# Patient Record
Sex: Female | Born: 1985 | Race: White | Hispanic: No | Marital: Married | Smoking: Never smoker
Health system: Southern US, Community
[De-identification: ages and names within clinical notes are randomized; demographics above are authoritative.]

## PROBLEM LIST (undated history)

## (undated) DIAGNOSIS — G43909 Migraine, unspecified, not intractable, without status migrainosus: Secondary | ICD-10-CM

## (undated) DIAGNOSIS — R87629 Unspecified abnormal cytological findings in specimens from vagina: Secondary | ICD-10-CM

## (undated) HISTORY — DX: Migraine, unspecified, not intractable, without status migrainosus: G43.909

## (undated) HISTORY — PX: NO PAST SURGERIES: SHX2092

## (undated) HISTORY — DX: Unspecified abnormal cytological findings in specimens from vagina: R87.629

---

## 2009-04-03 ENCOUNTER — Ambulatory Visit: Payer: Self-pay | Admitting: Family Medicine

## 2009-04-03 ENCOUNTER — Other Ambulatory Visit: Admission: RE | Admit: 2009-04-03 | Discharge: 2009-04-03 | Payer: Self-pay | Admitting: Family Medicine

## 2009-04-03 ENCOUNTER — Encounter: Payer: Self-pay | Admitting: Family Medicine

## 2009-04-04 ENCOUNTER — Encounter: Payer: Self-pay | Admitting: Family Medicine

## 2009-04-04 LAB — CONVERTED CEMR LAB: Clue Cells Wet Prep HPF POC: NONE SEEN

## 2009-04-06 LAB — CONVERTED CEMR LAB: Pap Smear: NORMAL

## 2010-03-25 ENCOUNTER — Telehealth: Payer: Self-pay | Admitting: Family Medicine

## 2010-05-06 ENCOUNTER — Ambulatory Visit: Payer: Self-pay | Admitting: Family Medicine

## 2010-05-06 ENCOUNTER — Other Ambulatory Visit: Admission: RE | Admit: 2010-05-06 | Discharge: 2010-05-06 | Payer: Self-pay | Admitting: Family Medicine

## 2010-05-06 DIAGNOSIS — R635 Abnormal weight gain: Secondary | ICD-10-CM

## 2010-05-06 DIAGNOSIS — G43829 Menstrual migraine, not intractable, without status migrainosus: Secondary | ICD-10-CM | POA: Insufficient documentation

## 2010-05-06 LAB — CONVERTED CEMR LAB: Pap Smear: NORMAL

## 2010-05-07 ENCOUNTER — Encounter: Payer: Self-pay | Admitting: Family Medicine

## 2010-05-07 LAB — CONVERTED CEMR LAB
ALT: 17 units/L (ref 0–35)
CO2: 22 meq/L (ref 19–32)
Cholesterol, target level: 200 mg/dL
Cholesterol: 236 mg/dL — ABNORMAL HIGH (ref 0–200)
Creatinine, Ser: 0.83 mg/dL (ref 0.40–1.20)
HDL: 57 mg/dL (ref >39)
Total Bilirubin: 0.3 mg/dL (ref 0.3–1.2)
Total CHOL/HDL Ratio: 4.1
VLDL: 31 mg/dL (ref 0–40)

## 2010-05-17 ENCOUNTER — Encounter: Payer: Self-pay | Admitting: Family Medicine

## 2010-11-12 NOTE — Progress Notes (Signed)
Summary: needs a refill on Birth Control  Phone Note Call from Patient Call back at [facility] Phone [phone]   Caller: Patient Summary of Call: pt called and states that she needs a refill on her birth control THIS WEEK. She is willing to schedule a ov appointment if she needs to. Please call pt. back and let her know the status on this. Thanks, Michaelle Copas Initial call taken by: Michaelle Copas,  March 25, 2010 9:53 AM    Prescriptions: KARIVA 0.15-0.02/0.01 MG (21/5) TABS (DESOGESTREL-ETHINYL ESTRADIOL) Take 1 tablet by mouth once a day  #28 Tablet x 1   Entered by:   Kathlene November   Authorized by:   Nani Gasser MD   Signed by:   Kathlene November on 03/25/2010   Method used:   [facility]  [address] [phone]       [address]       [place], [place]  [address]       Ph: [phone] or [phone]       Fax: [phone]   RxID:   [id number]

## 2010-11-12 NOTE — Medication Information (Signed)
Summary: Prior Authorization for Naratriptan/[employer]  Prior Authorization for Naratriptan/[employer]   Imported By: Lanelle Bal 06/20/2010 09:39:28  _____________________________________________________________________  External Attachment:    Type:   Image     Comment:   External Document

## 2010-11-12 NOTE — Assessment & Plan Note (Signed)
Summary: CPE with pap   Vital Signs:  Patient profile:   25 year old female Menstrual status:  regular LMP:     04/12/2010 Height:      67 inches Weight:      219 pounds BMI:     34.42 O2 Sat:      98 % on Room air Pulse rate:   71 / minute BP sitting:   125 / 83  (left arm) Cuff size:   large  Vitals Entered By: Payton Spark CMA (May 06, 2010 10:55 AM)  O2 Flow:  Room air CC: CPE w/ pap LMP (date): 04/12/2010     Menstrual Status regular Enter LMP: 04/12/2010 Last PAP Result Normal   Primary Care Provider:  Nani Gasser MD  CC:  CPE w/ pap.  History of Present Illness: 25 yo WF presents for CPE with pap smear.  She is a nulligravid, married Runner, broadcasting/film/video.  Her periods have improved with Garnette Scheuermann.  She is having HAs for a couple days prior to onset of her period.  OTC meds do not help.  HAs are severe and usually on the R side.  Had these prior to going on OCPs.  No aura.  She wants to stay on her birth control.  She has fam hx of premature heart dz (father).  Denies fam hx of breast or colon cancer.  Last Tetanus shot was < 10 yrs ago.    She is due for fasting labs.    Current Medications (verified): 1)  Kariva 0.15-0.02/0.01 Mg (21/5) Tabs (Desogestrel-Ethinyl Estradiol) .... Take 1 Tablet By Mouth Once A Day  Allergies (verified): No Known Drug Allergies  Past History:  Past Medical History: Reviewed history from 04/03/2009 and no changes required. None  Family History: Reviewed history from 04/03/2009 and no changes required. 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 Married.  No kids. Never Smoked Alcohol use-yes Drug use-no Regular exercise-yes  Review of Systems       The patient complains of weight gain.  The patient denies anorexia, fever, weight loss, vision loss, decreased hearing, hoarseness, chest pain, syncope, dyspnea on exertion, peripheral edema, prolonged  cough, headaches, hemoptysis, abdominal pain, melena, hematochezia, severe indigestion/heartburn, hematuria, incontinence, genital sores, muscle weakness, suspicious skin lesions, transient blindness, difficulty walking, depression, unusual weight change, abnormal bleeding, enlarged lymph nodes, angioedema, breast masses, and testicular masses.    Physical Exam  General:  alert, well-developed, well-nourished, well-hydrated, and overweight-appearing.   Head:  normocephalic and atraumatic.   Nose:  no nasal discharge.   Mouth:  good dentition and pharynx pink and moist.   Neck:  no masses.   Lungs:  Normal respiratory effort, chest expands symmetrically. Lungs are clear to auscultation, no crackles or wheezes. Heart:  Normal rate and regular rhythm. S1 and S2 normal without gallop, murmur, click, rub or other extra sounds. Abdomen:  Bowel sounds positive,abdomen soft and non-tender without masses, organomegaly Genitalia:  Pelvic Exam:        External: normal female genitalia without lesions or masses        Vagina: normal without lesions or masses        Cervix: normal without lesions or masses        Adnexa: normal bimanual exam without masses or fullness        Uterus: normal by palpation        Pap smear: performed Pulses:  2+  radial and pedal pulses Extremities:  no LE edema Neurologic:  gait normal.   Skin:  color normal and no suspicious lesions.   Cervical Nodes:  No lymphadenopathy noted Psych:  good eye contact, not anxious appearing, and not depressed appearing.     Impression & Recommendations:  Problem # 1:  ROUTINE GYNECOLOGICAL EXAMINATION (ICD-V72.31) Thin prep pap done. RFd Kariva.   BP at goal.  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 MVI daily.  Work on [employer], regular exercise, wt loss. Treat menstrual Migraine with Aleve + Naratriptan.  Call if any problems. Update fasting labs. Tetanus is UTD.  Complete Medication List: 1)  Kariva 0.15-0.02/0.01 Mg (21/5) Tabs  (Desogestrel-ethinyl estradiol) .... Take 1 tablet by mouth once a day 2)  Naratriptan Hcl 2.5 Mg Tabs (Naratriptan hcl) .Marland Kitchen.. 1 tab by mouth x 1 as needed migraine; repeat in 4 hrs if needed  Other Orders: T-Comprehensive Metabolic Panel [id number]) T-Lipid Profile ([id number]) T-TSH ([id number])  Patient Instructions: 1)  Stay on Kariva. 2)  Try Naratriptan for migraines.  Take with Aleve for menstrual migraine. 3)  Will call you w/ lab and pap results this wk. 4)  Return for f/u migraines in 3 mos. Prescriptions: NARATRIPTAN HCL 2.5 MG TABS (NARATRIPTAN HCL) 1 tab by mouth x 1 as needed migraine; repeat in 4 hrs if needed  #9 x 2   Entered and Authorized by:   Seymour Bars DO   Signed by:   Seymour Bars DO on 05/06/2010   Method used:   [phone]       [address]       [place], [place]  [address]       Ph: [phone] or [phone]       Fax: [phone]   RxID:   [id number] KARIVA 0.15-0.02/0.01 MG (21/5) TABS (DESOGESTREL-ETHINYL ESTRADIOL) Take 1 tablet by mouth once a day  #28 Tablet x 12   Entered and Authorized by:   Seymour Bars DO   Signed by:   Seymour Bars DO on 05/06/2010   Method used:   [facility] [phone][facility] (retail)       [address]       [place], [place]  [address]       Ph: [phone] or [phone]       Fax: [phone]   RxID:   [id number]

## 2011-05-04 ENCOUNTER — Encounter: Payer: Self-pay | Admitting: Family Medicine

## 2011-05-07 ENCOUNTER — Encounter: Payer: Self-pay | Admitting: Family Medicine

## 2011-05-07 ENCOUNTER — Other Ambulatory Visit (HOSPITAL_COMMUNITY)
Admission: RE | Admit: 2011-05-07 | Discharge: 2011-05-07 | Disposition: A | Discharge status: Home / Self Care | Payer: BC Managed Care – PPO | Source: Ambulatory Visit | Attending: Family Medicine | Admitting: Family Medicine

## 2011-05-07 ENCOUNTER — Ambulatory Visit (INDEPENDENT_AMBULATORY_CARE_PROVIDER_SITE_OTHER): Payer: BC Managed Care – PPO | Admitting: Family Medicine

## 2011-05-07 VITALS — BP 110/75 | HR 67 | Ht 67.5 in | Wt 210.0 lb

## 2011-05-07 DIAGNOSIS — Z1159 Encounter for screening for other viral diseases: Secondary | ICD-10-CM | POA: Insufficient documentation

## 2011-05-07 DIAGNOSIS — Z01419 Encounter for gynecological examination (general) (routine) without abnormal findings: Secondary | ICD-10-CM

## 2011-05-07 DIAGNOSIS — Z23 Encounter for immunization: Secondary | ICD-10-CM

## 2011-05-07 DIAGNOSIS — Z113 Encounter for screening for infections with a predominantly sexual mode of transmission: Secondary | ICD-10-CM | POA: Insufficient documentation

## 2011-05-07 MED ORDER — DESOGESTREL-ETHINYL ESTRADIOL 0.15-0.02/0.01 MG (21/5) PO TABS: 1.0000 | ORAL_TABLET | Freq: Every day | ORAL | Status: DC

## 2011-05-07 MED ORDER — IMIQUIMOD 5 % EX CREA: TOPICAL_CREAM | CUTANEOUS | Status: AC

## 2011-05-07 MED ORDER — TETANUS-DIPHTH-ACELL PERTUSSIS 5-2-15.5 LF-MCG/0.5 IM SUSP: 0.5000 mL | Freq: Once | INTRAMUSCULAR | Status: DC

## 2011-05-07 NOTE — Patient Instructions (Signed)
Tdap updated today.  Trial of Imiquomod cream 3 x a wk for warts.   Let me know if it's working after 6-8 wks.  Will call you w/ pap smear results in the next 7 days. Labs OK 04-2010.  Return for next physical in 1 yr.

## 2011-05-07 NOTE — Progress Notes (Signed)
  Subjective:    Patient ID: Rebekah Hicks, female    DOB: 03/13/1986, 25 y.o.   MRN: [id number]  HPI  25 yo WF presents for CPE with pap.  Periods are regular on Kariva.  She is married and nulligravid.  Not ready to conceive.  She cannot remember her last Tdap vaccine.  She is a Runner, broadcasting/film/video.  She rarely gets migraines.  advil usually works well.  deneis fam hx of colon or breast CA.  Does have fam hx of premature heart dz.  Had normal labs last year.  BP 110/75  Pulse 67  Ht 5' 7.5" (1.715 m)  Wt 210 lb (95.255 kg)  BMI 32.41 kg/m2  SpO2 100%  LMP 04/04/2011     Review of Systems Gen: no fevers, chills, hot flashes, night sweats, change in weight GI: no N/V/C/D GU: no dysuria, incontinence or sexual dysfunction CV: no chest pain, DOE, palpitations s or edema Pulm:  Denies CP, SOB or chronic cough      Objective:   Physical Exam  Genitourinary: Vagina normal and uterus normal. No vaginal discharge found.       Thin prep pap done.  Skin:       Fleshy small warts on the L>R knee      Gen: alert, well groomed in NAD Neck: no thyromegaly or cervical lymphadenopathy CV: RRR w/o murmur, no audible carotid bruits or abdominal aortic bruits Ext: no edema, clubbing or cyanosis Lungs: CTA bilat w/o W/R/R; nonlabored HEENT:  [place]/AT; PERRLA; oropharynx pink and moist with good dentition Abd: soft, NT, ND, NABS, No HSM, no audible AA bruits Skin: warm and dry; no rash, pallor or jaundice Psych: does not appear anxious or depressed; answers questions appropriately     Assessment & Plan:  Assesment:  1. CPE- Keeping healthy checklist for women reviewed today.  BP at goal.  BMI 32  in the class I obesity range.     Labs ordered last year, normal.   Contraception- refilled. Thin prep pap updated today. Encouraged healthy diet, regular exercise, MVI daily. Return for next physical in 1 yr.   Tdap updated today. For multiple tiny warts on both knees - will try Aldara Cream x 4-6 wks.   If not improving, please call.

## 2011-05-09 ENCOUNTER — Telehealth: Payer: Self-pay | Admitting: Family Medicine

## 2011-05-09 DIAGNOSIS — Z01419 Encounter for gynecological examination (general) (routine) without abnormal findings: Secondary | ICD-10-CM

## 2011-05-09 NOTE — Telephone Encounter (Signed)
Pls let pt know that her pap smear came back normal. I don't see a GC/ CHL -- this should be done on all women < 26. See if [facility] can add this.

## 2011-05-13 NOTE — Telephone Encounter (Signed)
Per Rebekah Hicks at [facility] they will add GC/CHL. Pt notified of pap result.

## 2011-05-14 ENCOUNTER — Telehealth: Payer: Self-pay | Admitting: Family Medicine

## 2011-05-14 NOTE — Telephone Encounter (Signed)
Pls let pt know that her pap smear came back normal.  Repeat in 1-2 yrs.   

## 2011-05-15 ENCOUNTER — Telehealth: Payer: Self-pay | Admitting: Family Medicine

## 2011-05-15 NOTE — Telephone Encounter (Signed)
LMOM informing Pt  

## 2011-05-15 NOTE — Telephone Encounter (Signed)
LMOM advising pt of results and rec.

## 2011-05-15 NOTE — Telephone Encounter (Signed)
Pls let pt know that her gonorrhea and chlamydia tests came back normal.

## 2011-05-15 NOTE — Telephone Encounter (Signed)
Pls let pt know that her gonorrhea and chlamydia tests came back negative.

## 2011-06-02 ENCOUNTER — Other Ambulatory Visit: Payer: Self-pay | Admitting: Family Medicine

## 2012-04-21 ENCOUNTER — Encounter: Payer: Self-pay | Admitting: Physician Assistant

## 2012-04-21 ENCOUNTER — Ambulatory Visit (INDEPENDENT_AMBULATORY_CARE_PROVIDER_SITE_OTHER): Payer: BC Managed Care – PPO | Admitting: Physician Assistant

## 2012-04-21 VITALS — BP 126/84 | HR 99 | Ht 67.5 in | Wt 217.0 lb

## 2012-04-21 DIAGNOSIS — Z131 Encounter for screening for diabetes mellitus: Secondary | ICD-10-CM

## 2012-04-21 DIAGNOSIS — Z0000 Encounter for general adult medical examination without abnormal findings: Secondary | ICD-10-CM

## 2012-04-21 DIAGNOSIS — Z13220 Encounter for screening for lipoid disorders: Secondary | ICD-10-CM

## 2012-04-21 LAB — COMPREHENSIVE METABOLIC PANEL
ALT: 16 U/L (ref 0–35)
AST: 16 U/L (ref 0–37)
Albumin: 4.4 g/dL (ref 3.5–5.2)
Alkaline Phosphatase: 64 U/L (ref 39–117)
CO2: 26 mEq/L (ref 19–32)
Calcium: 9.6 mg/dL (ref 8.4–10.5)
Potassium: 4.3 mEq/L (ref 3.5–5.3)
Sodium: 138 mEq/L (ref 135–145)
Total Bilirubin: 0.4 mg/dL (ref 0.3–1.2)
Total Protein: 7.2 g/dL (ref 6.0–8.3)

## 2012-04-21 LAB — LIPID PANEL
HDL: 69 mg/dL (ref >39)
LDL Cholesterol: 144 mg/dL — ABNORMAL HIGH (ref 0–99)
Total CHOL/HDL Ratio: 3.6 Ratio

## 2012-04-21 MED ORDER — DESOGESTREL-ETHINYL ESTRADIOL 0.15-0.02/0.01 MG (21/5) PO TABS: 1.0000 | ORAL_TABLET | Freq: Every day | ORAL | Status: DC

## 2012-04-21 NOTE — Patient Instructions (Addendum)
Will call with labs. Refilled birth control. Follow up in 1 year.  Exercise a week. Balanced diet.  Calcium 500mg  twice a day or 4 servings of dairy.

## 2012-04-21 NOTE — Progress Notes (Signed)
  Subjective:     Rebekah Hicks is a 26 y.o. female and is here for a comprehensive physical exam. The patient reports no problems.  History   Social History  . Marital Status: Single    Spouse Name: N/A    Number of Children: N/A  . Years of Education: N/A   Occupational History  . Not on file.   Social History Main Topics  . Smoking status: Never Smoker   . Smokeless tobacco: Not on file  . Alcohol Use: Yes  . Drug Use: No  . Sexually Active:    Other Topics Concern  . Not on file   Social History Narrative  . No narrative on file   Health Maintenance  Topic Date Due  . Influenza Vaccine  07/13/2012  . Pap Smear  05/06/2014  . Tetanus/tdap  05/06/2021    The following portions of the patient's history were reviewed and updated as appropriate: allergies, current medications, past family history, past medical history, past social history, past surgical history and problem list.  Review of Systems A comprehensive review of systems was negative.   Objective:    BP 126/84  Pulse 99  Ht 5' 7.5" (1.715 m)  Wt 217 lb (98.431 kg)  BMI 33.49 kg/m2  SpO2 98%  LMP 04/11/2012 General appearance: alert, cooperative and appears stated age Head: Normocephalic, without obvious abnormality, atraumatic Eyes: conjunctivae/corneas clear. PERRL, EOM's intact. Fundi benign. Ears: normal TM's and external ear canals both ears Nose: Nares normal. Septum midline. Mucosa normal. No drainage or sinus tenderness. Throat: lips, mucosa, and tongue normal; teeth and gums normal Neck: no adenopathy, no carotid bruit, no JVD, supple, symmetrical, trachea midline and thyroid not enlarged, symmetric, no tenderness/mass/nodules Back: symmetric, no curvature. ROM normal. No CVA tenderness. Lungs: clear to auscultation bilaterally Breasts: normal appearance, no masses or tenderness Heart: regular rate and rhythm, S1, S2 normal, no murmur, click, rub or gallop Abdomen: soft, non-tender; bowel  sounds normal; no masses,  no organomegaly Pelvic: external genitalia normal, no adnexal masses or tenderness, no cervical motion tenderness, uterus normal size, shape, and consistency and vagina normal without discharge Extremities: extremities normal, atraumatic, no cyanosis or edema Pulses: 2+ and symmetric Skin: Skin color, texture, turgor normal. No rashes or lesions Lymph nodes: Cervical, supraclavicular, and axillary nodes normal. Neurologic: Grossly normal    Assessment:    Healthy female exam.      Plan:    CPE/Birth Control- Refilled Birth Control for 1 year. Exercise goal 150 minutes per week. Reminder to have a balanced diet full of fruit and vegetables. Calcium was recommended 4 servings of diary daily or 500mg  twice a day. Vaccines up to date. Follow up in 1 year.  See After Visit Summary for Counseling Recommendations

## 2013-03-22 ENCOUNTER — Other Ambulatory Visit: Payer: Self-pay | Admitting: *Deleted

## 2013-03-22 MED ORDER — DESOGESTREL-ETHINYL ESTRADIOL 0.15-0.02/0.01 MG (21/5) PO TABS: 1.0000 | ORAL_TABLET | Freq: Every day | ORAL | Status: DC

## 2013-05-23 ENCOUNTER — Encounter: Payer: Self-pay | Admitting: Physician Assistant

## 2013-05-23 ENCOUNTER — Ambulatory Visit (INDEPENDENT_AMBULATORY_CARE_PROVIDER_SITE_OTHER): Payer: BC Managed Care – PPO | Admitting: Physician Assistant

## 2013-05-23 ENCOUNTER — Other Ambulatory Visit: Payer: Self-pay | Admitting: Physician Assistant

## 2013-05-23 VITALS — BP 127/85 | HR 58 | Wt 199.0 lb

## 2013-05-23 DIAGNOSIS — Z0000 Encounter for general adult medical examination without abnormal findings: Secondary | ICD-10-CM

## 2013-05-23 DIAGNOSIS — Z131 Encounter for screening for diabetes mellitus: Secondary | ICD-10-CM

## 2013-05-23 DIAGNOSIS — E782 Mixed hyperlipidemia: Secondary | ICD-10-CM | POA: Insufficient documentation

## 2013-05-23 LAB — COMPLETE METABOLIC PANEL WITH GFR
AST: 19 U/L (ref 0–37)
BUN: 8 mg/dL (ref 6–23)
Calcium: 9.6 mg/dL (ref 8.4–10.5)
Chloride: 106 mEq/L (ref 96–112)
Creat: 0.87 mg/dL (ref 0.50–1.10)
GFR, Est African American: >89 mL/min
GFR, Est Non African American: >89 mL/min
Glucose, Bld: 93 mg/dL (ref 70–99)

## 2013-05-23 LAB — LIPID PANEL
Cholesterol: 226 mg/dL — ABNORMAL HIGH (ref 0–200)
Triglycerides: 125 mg/dL (ref <150)
VLDL: 25 mg/dL (ref 0–40)

## 2013-05-23 MED ORDER — DESOGESTREL-ETHINYL ESTRADIOL 0.15-0.02/0.01 MG (21/5) PO TABS: 1.0000 | ORAL_TABLET | Freq: Every day | ORAL | Status: DC

## 2013-05-23 NOTE — Patient Instructions (Signed)

## 2013-05-23 NOTE — Progress Notes (Signed)
  Subjective:     Jaidalyn Schillo is a 27 y.o. female and is here for a comprehensive physical exam. The patient reports no problems.  Patient exercises 3-4 times a week. She admits to not making a lot of diet changes.   History   Social History  . Marital Status: Single    Spouse Name: N/A    Number of Children: N/A  . Years of Education: N/A   Occupational History  . Not on file.   Social History Main Topics  . Smoking status: Never Smoker   . Smokeless tobacco: Not on file  . Alcohol Use: Yes  . Drug Use: No  . Sexually Active: Not on file   Other Topics Concern  . Not on file   Social History Narrative  . No narrative on file   Health Maintenance  Topic Date Due  . Influenza Vaccine  06/13/2013  . Pap Smear  05/06/2014  . Tetanus/tdap  05/06/2021    The following portions of the patient's history were reviewed and updated as appropriate: allergies, current medications, past family history, past medical history, past social history, past surgical history and problem list.  Review of Systems A comprehensive review of systems was negative.   Objective:    BP 127/85  Pulse 58  Wt 199 lb (90.266 kg)  BMI 30.69 kg/m2  LMP 05/05/2013 General appearance: alert, cooperative, appears stated age and mildly obese Head: Normocephalic, without obvious abnormality, atraumatic Eyes: conjunctivae/corneas clear. PERRL, EOM's intact. Fundi benign. Ears: normal TM's and external ear canals both ears Nose: Nares normal. Septum midline. Mucosa normal. No drainage or sinus tenderness. Throat: lips, mucosa, and tongue normal; teeth and gums normal Neck: no adenopathy, no carotid bruit, no JVD, supple, symmetrical, trachea midline and thyroid not enlarged, symmetric, no tenderness/mass/nodules Back: symmetric, no curvature. ROM normal. No CVA tenderness. Lungs: clear to auscultation bilaterally Heart: regular rate and rhythm, S1, S2 normal, no murmur, click, rub or  gallop Abdomen: soft, non-tender; bowel sounds normal; no masses,  no organomegaly Extremities: extremities normal, atraumatic, no cyanosis or edema Pulses: 2+ and symmetric Skin: Skin color, texture, turgor normal. No rashes or lesions Lymph nodes: Cervical, supraclavicular, and axillary nodes normal. Neurologic: Grossly normal    Assessment:    Healthy female exam.      Plan:    CPE-pap up to date. Vaccines up to date. Discussed calcium and vitamin D 1200/800 added to diet. Pt has lost 18lbs in 1 year. Encouraged her to continue exercising regularly. Will recheck fasting labs. Encouraged pt to adopt a low saturated fat diet to help decrease LDL. Pt questions if there is a OCP that insurance will pay in full. I told her to call insurance company and find out. I am more than happy to write for. In the meantime did give 11 refills of previous OCP.    See After Visit Summary for Counseling Recommendations

## 2013-05-24 ENCOUNTER — Telehealth: Payer: Self-pay | Admitting: Physician Assistant

## 2013-05-24 NOTE — Telephone Encounter (Signed)
Call pt: LDL has decreased some. TG have decreased. HDL has stayed high and good. Continue doing what you are doing. Labs are looking better every year. Keep up good work. Liver, kidney, glucose look great.

## 2013-05-25 NOTE — Telephone Encounter (Signed)
Called pt and LMOM of results.  Instructed to call back if any questions. Barry Dienes, LPN

## 2013-06-29 ENCOUNTER — Other Ambulatory Visit: Payer: Self-pay | Admitting: Physician Assistant

## 2014-02-22 ENCOUNTER — Encounter: Payer: Self-pay | Admitting: Physician Assistant

## 2014-02-22 ENCOUNTER — Other Ambulatory Visit (HOSPITAL_COMMUNITY)
Admission: RE | Admit: 2014-02-22 | Discharge: 2014-02-22 | Disposition: A | Discharge status: Home / Self Care | Payer: BC Managed Care – PPO | Source: Ambulatory Visit | Attending: Family Medicine | Admitting: Family Medicine

## 2014-02-22 ENCOUNTER — Ambulatory Visit (INDEPENDENT_AMBULATORY_CARE_PROVIDER_SITE_OTHER): Payer: BC Managed Care – PPO | Admitting: Physician Assistant

## 2014-02-22 VITALS — BP 111/71 | HR 56 | Ht 66.0 in | Wt 194.0 lb

## 2014-02-22 DIAGNOSIS — Z113 Encounter for screening for infections with a predominantly sexual mode of transmission: Secondary | ICD-10-CM | POA: Insufficient documentation

## 2014-02-22 DIAGNOSIS — N930 Postcoital and contact bleeding: Secondary | ICD-10-CM | POA: Insufficient documentation

## 2014-02-22 DIAGNOSIS — Z01419 Encounter for gynecological examination (general) (routine) without abnormal findings: Secondary | ICD-10-CM | POA: Insufficient documentation

## 2014-02-22 DIAGNOSIS — N760 Acute vaginitis: Secondary | ICD-10-CM | POA: Insufficient documentation

## 2014-02-22 DIAGNOSIS — IMO0002 Reserved for concepts with insufficient information to code with codable children: Secondary | ICD-10-CM | POA: Insufficient documentation

## 2014-02-22 NOTE — Patient Instructions (Signed)
Will call with results

## 2014-02-22 NOTE — Progress Notes (Signed)
   Subjective:    Patient ID: Rebekah Hicks, female    DOB: 23-Nov-1985, 28 y.o.   MRN: [id number]  HPI Pt presents to the clinic with bleeding after intercourse and some discomfort during intercourse. She has changed partners in the last month. She occasional had pain during intercourse with the last partner but not as bad as this one. She does admit that he is larger in size than previous partner. Pain and bleeding is not every sexual intercourse but very often. Last pap 2012. Denies any urinary symptoms or vaginal discharge. No abdominal pain, fever, chills, n/v/d.      Review of Systems     Objective:   Physical Exam  Constitutional: She is oriented to person, place, and time. She appears well-developed and well-nourished.  Abdominal: Soft. Bowel sounds are normal. She exhibits no distension and no mass. There is no tenderness. There is no rebound and no guarding.  Genitourinary:    Neurological: She is alert and oriented to person, place, and time.          Assessment & Plan:  Postcoital bleeding/painful intercourse/cervical ox lesion- pap was done today since it had been 3 years and new partner. STD testing was done. Alerted pt that I did see ulceration around cervical os that bled very easily. Will wait to get lab testing but may still send to GYN to take a look to be 100 percent. expained symptoms could also be due to size and new partner.

## 2014-02-28 ENCOUNTER — Other Ambulatory Visit: Payer: Self-pay | Admitting: Physician Assistant

## 2014-02-28 MED ORDER — FLUCONAZOLE 150 MG PO TABS: 150.0000 mg | ORAL_TABLET | Freq: Once | ORAL | Status: DC

## 2014-05-24 ENCOUNTER — Other Ambulatory Visit: Payer: Self-pay | Admitting: *Deleted

## 2014-05-24 MED ORDER — DESOGESTREL-ETHINYL ESTRADIOL 0.15-0.02/0.01 MG (21/5) PO TABS: ORAL_TABLET | ORAL | Status: DC

## 2014-05-30 ENCOUNTER — Other Ambulatory Visit: Payer: Self-pay | Admitting: *Deleted

## 2014-05-30 MED ORDER — DESOGESTREL-ETHINYL ESTRADIOL 0.15-0.02/0.01 MG (21/5) PO TABS: ORAL_TABLET | ORAL | Status: DC

## 2014-08-23 ENCOUNTER — Other Ambulatory Visit: Payer: Self-pay | Admitting: Physician Assistant

## 2014-08-25 ENCOUNTER — Encounter: Payer: Self-pay | Admitting: Family Medicine

## 2014-08-25 ENCOUNTER — Ambulatory Visit (INDEPENDENT_AMBULATORY_CARE_PROVIDER_SITE_OTHER): Payer: BC Managed Care – PPO | Admitting: Family Medicine

## 2014-08-25 VITALS — BP 104/67 | HR 89 | Temp 99.4°F | Wt 201.0 lb

## 2014-08-25 DIAGNOSIS — J020 Streptococcal pharyngitis: Secondary | ICD-10-CM | POA: Diagnosis not present

## 2014-08-25 DIAGNOSIS — J029 Acute pharyngitis, unspecified: Secondary | ICD-10-CM

## 2014-08-25 MED ORDER — KETOROLAC TROMETHAMINE 60 MG/2ML IM SOLN
60.0000 mg | Freq: Once | INTRAMUSCULAR | Status: AC
  Administered 2014-08-25: 60 mg via INTRAMUSCULAR

## 2014-08-25 MED ORDER — PENICILLIN V POTASSIUM 500 MG PO TABS: ORAL_TABLET | ORAL | Status: AC

## 2014-08-25 NOTE — Progress Notes (Signed)
CC: Rebekah Hicks is a 28 y.o. female is here for Sore Throat   Subjective: HPI:   sore throat localized in the back of the throat mostly on the right. Pain is right now 7 out of 10 in severity. Last night was 10 out of 10 in severity. Worse with swallowing. Accompanied by chills and body aches. Slightly improved with Tylenol. Nothing else makes better or worse. Present all hours of the day. Denies cough, shortness of breath, nasal congestion, sinus pressure, chest pain, nor rash.   Review Of Systems Outlined In HPI  No past medical history on file.  No past surgical history on file. Family History  Problem Relation Age of Onset  . Heart attack Father   . Hypertension Father   . Hyperlipidemia Father   . Diabetes Father   . Diabetes      grandparent    History   Social History  . Marital Status: Single    Spouse Name: N/A    Number of Children: N/A  . Years of Education: N/A   Occupational History  . Not on file.   Social History Main Topics  . Smoking status: Never Smoker   . Smokeless tobacco: Not on file  . Alcohol Use: Yes  . Drug Use: No  . Sexual Activity: Not on file   Other Topics Concern  . Not on file   Social History Narrative     Objective: BP 104/67 mmHg  Pulse 89  Temp(Src) 99.4 F (37.4 C) (Oral)  Wt 201 lb (91.173 kg)  General: Alert and Oriented, No Acute Distress HEENT: Pupils equal, round, reactive to light. Conjunctivae clear.  External ears unremarkable, canals clear with intact TMs with appropriate landmarks.  Middle ear appears open without effusion. Pink inferior turbinates.  Moist mucous membranes, moderately enlarged right tonsil, uvula is midline..  Shotty right anterior chain lymphadenopathy. Lungs: Clear to auscultation bilaterally, no wheezing/ronchi/rales.  Comfortable work of breathing. Good air movement. Extremities: No peripheral edema.  Strong peripheral pulses.  Mental Status: No depression, anxiety, nor agitation. Skin:  Warm and dry.  Assessment & Plan: Rebekah Hicks was seen today for sore throat.  Diagnoses and associated orders for this visit:  Acute pharyngitis, unspecified pharyngitis type - POCT rapid strep A - ketorolac (TORADOL) injection 60 mg; Inject 2 mLs (60 mg total) into the muscle once.  Streptococcal sore throat - penicillin v potassium (VEETID) 500 MG tablet; One by mouth every 12 hours for ten days, take 1 hour before or 2 hours after meals.    High suspicion for strep throat therefore start penicillin. Provided with ketorolac here in the clinic due to severity of pain.   Return if symptoms worsen or fail to improve.

## 2014-11-17 ENCOUNTER — Other Ambulatory Visit: Payer: Self-pay | Admitting: Physician Assistant

## 2015-03-03 ENCOUNTER — Other Ambulatory Visit: Payer: Self-pay | Admitting: Physician Assistant

## 2015-04-28 ENCOUNTER — Other Ambulatory Visit: Payer: Self-pay | Admitting: Physician Assistant

## 2015-05-02 ENCOUNTER — Other Ambulatory Visit: Payer: Self-pay | Admitting: *Deleted

## 2015-05-02 MED ORDER — DESOGESTREL-ETHINYL ESTRADIOL 0.15-0.02/0.01 MG (21/5) PO TABS: 1.0000 | ORAL_TABLET | Freq: Every day | ORAL | Status: DC

## 2015-05-18 ENCOUNTER — Encounter: Payer: BC Managed Care – PPO | Admitting: Family Medicine

## 2015-05-30 ENCOUNTER — Other Ambulatory Visit: Payer: Self-pay | Admitting: Family Medicine

## 2015-05-31 ENCOUNTER — Other Ambulatory Visit (HOSPITAL_COMMUNITY)
Admission: RE | Admit: 2015-05-31 | Discharge: 2015-05-31 | Disposition: A | Discharge status: Home / Self Care | Payer: BC Managed Care – PPO | Source: Ambulatory Visit | Attending: Family Medicine | Admitting: Family Medicine

## 2015-05-31 ENCOUNTER — Ambulatory Visit (INDEPENDENT_AMBULATORY_CARE_PROVIDER_SITE_OTHER): Payer: BC Managed Care – PPO | Admitting: Family Medicine

## 2015-05-31 ENCOUNTER — Encounter: Payer: Self-pay | Admitting: Family Medicine

## 2015-05-31 VITALS — BP 129/87 | HR 64 | Wt 198.0 lb

## 2015-05-31 DIAGNOSIS — R87810 Cervical high risk human papillomavirus (HPV) DNA test positive: Secondary | ICD-10-CM | POA: Diagnosis present

## 2015-05-31 DIAGNOSIS — Z124 Encounter for screening for malignant neoplasm of cervix: Secondary | ICD-10-CM

## 2015-05-31 DIAGNOSIS — Z01411 Encounter for gynecological examination (general) (routine) with abnormal findings: Secondary | ICD-10-CM | POA: Insufficient documentation

## 2015-05-31 DIAGNOSIS — IMO0002 Reserved for concepts with insufficient information to code with codable children: Secondary | ICD-10-CM

## 2015-05-31 DIAGNOSIS — Z1151 Encounter for screening for human papillomavirus (HPV): Secondary | ICD-10-CM | POA: Insufficient documentation

## 2015-05-31 DIAGNOSIS — E782 Mixed hyperlipidemia: Secondary | ICD-10-CM | POA: Diagnosis not present

## 2015-05-31 DIAGNOSIS — Z114 Encounter for screening for human immunodeficiency virus [HIV]: Secondary | ICD-10-CM

## 2015-05-31 DIAGNOSIS — Z113 Encounter for screening for infections with a predominantly sexual mode of transmission: Secondary | ICD-10-CM | POA: Insufficient documentation

## 2015-05-31 DIAGNOSIS — R896 Abnormal cytological findings in specimens from other organs, systems and tissues: Secondary | ICD-10-CM | POA: Diagnosis not present

## 2015-05-31 DIAGNOSIS — Z0000 Encounter for general adult medical examination without abnormal findings: Secondary | ICD-10-CM | POA: Diagnosis not present

## 2015-05-31 MED ORDER — DESOGESTREL-ETHINYL ESTRADIOL 0.15-0.02/0.01 MG (21/5) PO TABS: 1.0000 | ORAL_TABLET | Freq: Every day | ORAL | Status: DC

## 2015-05-31 NOTE — Patient Instructions (Addendum)
Keep up a regular exercise program and make sure you are eating a healthy diet Try to eat 4 servings of dairy a day, or if you are lactose intolerant take a calcium with vitamin D daily.  Your vaccines are up to date.   Recommend My Fitness Pal to help track calories.

## 2015-05-31 NOTE — Progress Notes (Signed)
  Subjective:     Rebekah Hicks is a 29 y.o. female and is here for a comprehensive physical exam. The patient reports problems - she would like to weight 170 lb and she is having a hard time losing weight. she wants to discuss option. She has been working out almost every day this summer but is not seeing the scale change.  Social History   Social History  . Marital Status: Single    Spouse Name: N/A  . Number of Children: N/A  . Years of Education: N/A   Occupational History  . Not on file.   Social History Main Topics  . Smoking status: Never Smoker   . Smokeless tobacco: Not on file  . Alcohol Use: Yes  . Drug Use: No  . Sexual Activity: Not on file   Other Topics Concern  . Not on file   Social History Narrative   Health Maintenance  Topic Date Due  . HIV Screening  06/22/2001  . INFLUENZA VACCINE  05/14/2015  . PAP SMEAR  02/22/2017  . TETANUS/TDAP  05/06/2021    The following portions of the patient's history were reviewed and updated as appropriate: allergies, current medications, past family history, past medical history, past social history, past surgical history and problem list.  Review of Systems A comprehensive review of systems was negative.   Objective:    BP 129/87 mmHg  Pulse 64  Wt 198 lb (89.812 kg)  LMP 05/21/2015 General appearance: alert, cooperative and appears stated age Head: Normocephalic, without obvious abnormality, atraumatic Eyes: conj clear, EOMi, PEERLA Ears: normal TM's and external ear canals both ears Nose: Nares normal. Septum midline. Mucosa normal. No drainage or sinus tenderness. Throat: lips, mucosa, and tongue normal; teeth and gums normal Neck: no adenopathy, no carotid bruit, no JVD, supple, symmetrical, trachea midline and thyroid not enlarged, symmetric, no tenderness/mass/nodules Back: symmetric, no curvature. ROM normal. No CVA tenderness. Lungs: clear to auscultation bilaterally Breasts: normal appearance, no  masses or tenderness Heart: regular rate and rhythm, S1, S2 normal, no murmur, click, rub or gallop Abdomen: soft, non-tender; bowel sounds normal; no masses,  no organomegaly Pelvic: cervix normal in appearance, external genitalia normal, no adnexal masses or tenderness, no cervical motion tenderness, rectovaginal septum normal, uterus normal size, shape, and consistency, vagina normal without discharge and cervix is easily friable Extremities: extremities normal, atraumatic, no cyanosis or edema Pulses: 2+ and symmetric Skin: Skin color, texture, turgor normal. No rashes or lesions Lymph nodes: Cervical, supraclavicular, and axillary nodes normal. Neurologic: Alert and oriented X 3, normal strength and tone. Normal symmetric reflexes. Normal coordination and gait    Assessment:    Healthy female exam.      Plan:     See After Visit Summary for Counseling Recommendations  Keep up a regular exercise program and make sure you are eating a healthy diet Try to eat 4 servings of dairy a day, or if you are lactose intolerant take a calcium with vitamin D daily.  Your vaccines are up to date.   Will do STD testing for screening purposes.   Refilled birth control for one year.

## 2015-06-02 LAB — COMPLETE METABOLIC PANEL WITH GFR
ALBUMIN: 4.4 g/dL (ref 3.6–5.1)
ALT: 22 U/L (ref 6–29)
AST: 20 U/L (ref 10–30)
Alkaline Phosphatase: 61 U/L (ref 33–115)
BILIRUBIN TOTAL: 0.6 mg/dL (ref 0.2–1.2)
BUN: 12 mg/dL (ref 7–25)
CALCIUM: 10.2 mg/dL (ref 8.6–10.2)
CO2: 25 mmol/L (ref 20–31)
Chloride: 103 mmol/L (ref 98–110)
Creat: 0.85 mg/dL (ref 0.50–1.10)
GFR, Est African American: >89 mL/min (ref >=60)
GLUCOSE: 89 mg/dL (ref 65–99)
POTASSIUM: 4.5 mmol/L (ref 3.5–5.3)
Sodium: 140 mmol/L (ref 135–146)
TOTAL PROTEIN: 7.3 g/dL (ref 6.1–8.1)

## 2015-06-02 LAB — RPR

## 2015-06-02 LAB — LIPID PANEL
CHOL/HDL RATIO: 2.8 ratio (ref <=5.0)
CHOLESTEROL: 220 mg/dL — AB (ref 125–200)
HDL: 78 mg/dL (ref >=46)
LDL Cholesterol: 109 mg/dL (ref <130)
TRIGLYCERIDES: 167 mg/dL — AB (ref <150)
VLDL: 33 mg/dL — ABNORMAL HIGH (ref <30)

## 2015-06-02 LAB — HIV ANTIBODY (ROUTINE TESTING W REFLEX): HIV: NONREACTIVE

## 2015-06-04 LAB — CYTOLOGY - PAP

## 2015-06-12 ENCOUNTER — Telehealth: Payer: Self-pay

## 2015-06-12 NOTE — Addendum Note (Signed)
Addended by: Nani Gasser D on: 06/12/2015 07:53 AM   Modules accepted: Orders

## 2015-06-12 NOTE — Telephone Encounter (Signed)
Left message for patient to call office to schedule appointment. Received referral from dr. metheney office. 

## 2015-06-25 ENCOUNTER — Encounter: Payer: Self-pay | Admitting: Obstetrics & Gynecology

## 2015-06-25 ENCOUNTER — Ambulatory Visit (INDEPENDENT_AMBULATORY_CARE_PROVIDER_SITE_OTHER): Payer: BC Managed Care – PPO | Admitting: Obstetrics & Gynecology

## 2015-06-25 VITALS — BP 113/79 | HR 66 | Resp 16 | Ht 67.0 in | Wt 197.0 lb

## 2015-06-25 DIAGNOSIS — R87810 Cervical high risk human papillomavirus (HPV) DNA test positive: Secondary | ICD-10-CM | POA: Diagnosis not present

## 2015-06-25 DIAGNOSIS — R87610 Atypical squamous cells of undetermined significance on cytologic smear of cervix (ASC-US): Secondary | ICD-10-CM

## 2015-06-25 DIAGNOSIS — IMO0002 Reserved for concepts with insufficient information to code with codable children: Secondary | ICD-10-CM

## 2015-06-26 ENCOUNTER — Encounter: Payer: Self-pay | Admitting: Obstetrics & Gynecology

## 2015-06-26 DIAGNOSIS — IMO0002 Reserved for concepts with insufficient information to code with codable children: Secondary | ICD-10-CM | POA: Insufficient documentation

## 2015-06-26 NOTE — Progress Notes (Signed)
  Colposcopy Procedure Note  Indications: Pap smear 1 months ago showed: positvie for 18 adn 45. The prior pap showed no abnormalities.  Prior cervical/vaginal disease: none. Prior cervical treatment: no treatment.  Procedure Details  The risks and benefits of the procedure and Written informed consent obtained.  Speculum placed in vagina and excellent visualization of cervix achieved, cervix swabbed x 3 with acetic acid solution.  Findings: Cervix: acetowhite lesion(s) noted at 6 & 12 o'clock; SCJ visualized - lesion at 6 adn 12 o'clock, cervical biopsies taken at 6 adn 12 o'clock, specimen labelled and sent to pathology and hemostasis achieved with Monsel's solution.  ECC also sent to pathology Vaginal inspection: vaginal colposcopy not performed. Vulvar colposcopy: vulvar colposcopy not performed.  Specimens: as above  Complications: none.  Plan: Specimens labelled Rebekah Hicks Pathology. Will base further treatment on Pathology findings. Subjective:     Rebekah Hicks is a 29 y.o. woman who presents for ASCUS with HR HPV, +18 and 45.  Pt also complains of post coital bleeding.  It occurs very frequently.  It lasts for 12 hours  Nothing makes it better or worse.   The following portions of the patient's history were reviewed and updated as appropriate: allergies, current medications, past family history, past medical history, past social history, past surgical history and problem list.  Review of Systems A comprehensive review of systems was negative except for: as noted in HPI     Objective:    BP 113/79 mmHg  Pulse 66  Resp 16  Ht  (1.702 m)  Wt 197 lb (89.359 kg)  BMI 30.85 kg/m2  LMP 06/11/2015  General Appearance:    Alert, cooperative, no distress, appears stated age  Head:    Normocephalic, without obvious abnormality, atraumatic                    Pulmonary : Nml effort           GI:     Soft, non-tender,   Genitalia:    Normal female without lesion,  discharge or tenderness  Rectal:    No examined  Extremities:   Extremities normal, atraumatic, no cyanosis or edema  GU:   As below in colpo exam.  Skin:   Skin color, texture, turgor normal, no rashes or lesions                Assessment:    The patient has post-coital bleeding.   Abnml pap   Plan:    Colpo for abnml pap Will address post coital bleeding after colpo results

## 2015-06-29 ENCOUNTER — Encounter: Payer: Self-pay | Admitting: Obstetrics & Gynecology

## 2015-06-29 ENCOUNTER — Telehealth: Payer: Self-pay

## 2015-06-29 DIAGNOSIS — N871 Moderate cervical dysplasia: Secondary | ICD-10-CM | POA: Insufficient documentation

## 2015-06-29 NOTE — Telephone Encounter (Signed)
Patient called and idd by name and dob. Patient made aware of pap results and need for further action. Explained LEEP vs cryo to patient and that we will schedule her to come in and discuss with Dr. leggett which plan of care she would like to proceed with and then do the procedure the same day. Patient states understanding and scheduled appointment [facility]. Armandina Stammer RN BSN

## 2015-06-29 NOTE — Telephone Encounter (Signed)
-----   Message from Rebekah Dukes, MD sent at 06/29/2015  5:59 AM EDT ----- Pt needs either LEEP or Cryo.  She will need 30-45 minute appointment to have discussion, set up for procedure and complete procedure.

## 2015-07-30 ENCOUNTER — Encounter: Payer: Self-pay | Admitting: Obstetrics & Gynecology

## 2015-07-30 ENCOUNTER — Ambulatory Visit (INDEPENDENT_AMBULATORY_CARE_PROVIDER_SITE_OTHER): Payer: BC Managed Care – PPO | Admitting: Obstetrics & Gynecology

## 2015-07-30 VITALS — BP 105/70 | HR 56 | Resp 16 | Ht 67.0 in | Wt 202.0 lb

## 2015-07-30 DIAGNOSIS — Z01812 Encounter for preprocedural laboratory examination: Secondary | ICD-10-CM

## 2015-07-30 DIAGNOSIS — N871 Moderate cervical dysplasia: Secondary | ICD-10-CM | POA: Diagnosis not present

## 2015-07-30 LAB — POCT URINE PREGNANCY: Preg Test, Ur: NEGATIVE

## 2015-08-01 NOTE — Progress Notes (Signed)
   Subjective:    Patient ID: Rebekah Hicks, female    DOB: 28-May-1986, 29 y.o.   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  ECC negative.  TZ completely visualized. We discussed risks and benefits of both procedures (LEEP vs Cryo).  Pt decided on Cryo.  Review of Systems  Constitutional: Negative.   Respiratory: Negative.   Cardiovascular: Negative.   Gastrointestinal: Negative.   Genitourinary: Negative.        Objective:   Physical Exam  Constitutional: She is oriented to person, place, and time. She appears well-developed and well-nourished. No distress.  HENT:  Head: Normocephalic and atraumatic.  Eyes: Conjunctivae are normal.  Pulmonary/Chest: Effort normal.  Abdominal: Soft. Bowel sounds are normal. There is no tenderness.  Genitourinary:  TZ will fit inside largest cryo probe. Vagina  No lesion Ext Gen Tanner V  Musculoskeletal: She exhibits no edema.  Neurological: She is alert and oriented to person, place, and time.  Skin: Skin is warm and dry.  Psychiatric: She has a normal mood and affect.  Vitals reviewed.   GYNECOLOGY CLINIC PROCEDURE NOTE  Cryotherapy details The indications for cryotherapy were reviewed with the patient in detail. She was counseled about that efficacy of this procedure, and possible need for excisional procedure in the future if her cervical dysplasia persists.  The risks of the procedure where explained in detail and patient was told to expect a copious amount of discharge in the next few weeks. All her questions were answered, and written informed consent was obtained.  The patient was placed in the dorsal lithotomy position and a vaginal speculum was placed. Her cervix was visualized and patient was noted to have had normal size transformation zone. The appropriate cryotherapy probe was picked and affixed to cryotherapy apparatus. Then nitrogen gas was then activated, the probe was coated with lubricating  jelly and applied to the transformation zone of the cervix. This was kept in place for 3 minutes. The cryotherapy was then stopped and all instruments were removed from the patient's pelvis; a thawing period of 3 minutes was observed.  A second cycle of cryotherapy was then administered to the cervix for 3 minutes.       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

## 2015-08-14 ENCOUNTER — Encounter: Payer: Self-pay | Admitting: Obstetrics & Gynecology

## 2015-08-14 ENCOUNTER — Ambulatory Visit (INDEPENDENT_AMBULATORY_CARE_PROVIDER_SITE_OTHER): Payer: BC Managed Care – PPO | Admitting: Obstetrics & Gynecology

## 2015-08-14 VITALS — BP 109/73 | HR 71 | Resp 16 | Ht 67.0 in

## 2015-08-14 DIAGNOSIS — N72 Inflammatory disease of cervix uteri: Secondary | ICD-10-CM

## 2015-08-14 DIAGNOSIS — T283XXA Burn of internal genitourinary organs, initial encounter: Secondary | ICD-10-CM

## 2015-08-14 MED ORDER — CIPROFLOXACIN HCL 500 MG PO TABS: 500.0000 mg | ORAL_TABLET | Freq: Two times a day (BID) | ORAL | Status: DC

## 2015-08-14 MED ORDER — DOXYCYCLINE HYCLATE 100 MG PO CAPS: 100.0000 mg | ORAL_CAPSULE | Freq: Two times a day (BID) | ORAL | Status: DC

## 2015-08-14 MED ORDER — METRONIDAZOLE 500 MG PO TABS: 500.0000 mg | ORAL_TABLET | Freq: Two times a day (BID) | ORAL | Status: DC

## 2015-08-14 NOTE — Progress Notes (Signed)
   Subjective:    Patient ID: Nelda Buckslison Santoro, female    DOB: 07/10/1986, 29 y.o.   MRN: [id number]  HPI  29 yo female presents for f/u after cryo.  Pt had some watery yellow discharge as expected.  No pain.  No sexual intercourse yet.  Pt denies odor, pelvic pain, abdominal pain, fevers, rigor, chills.  Review of Systems  Constitutional: Negative.   Respiratory: Negative.   Cardiovascular: Negative.   Gastrointestinal: Negative.   Genitourinary: Positive for vaginal discharge.  Neurological: Negative.   Psychiatric/Behavioral: Negative.        Objective:   Physical Exam  Constitutional: She is oriented to person, place, and time. She appears well-developed and well-nourished. No distress.  HENT:  Head: Normocephalic and atraumatic.  Eyes: Conjunctivae are normal.  Pulmonary/Chest: Effort normal.  Abdominal: Soft. Bowel sounds are normal. She exhibits no distension and no mass. There is no tenderness. There is no rebound and no guarding.  Genitourinary: Vaginal discharge found.  Genitalia: Tanner 5 Vulva: No lesion Vagina: Nulliparous discharge and vaginal vault Cervix: Necrotic tissue covering cervical portia Uterus: Anteverted nontender Next the: No masses nontender  Musculoskeletal: She exhibits no edema.  Neurological: She is alert and oriented to person, place, and time.  Skin: Skin is warm and dry.  Psychiatric: She has a normal mood and affect.  Vitals reviewed.         Assessment & Plan:  29 year old female with cervicitis status post cryo- 1.  Antibiotic therapy with Cipro and Flagyl 2.  No vaginal intercourse 3.  Return to clinic immediately with abdominal pain,worsening malodorous discharge, fever, rigors, chills 4.  Return to clinic 1 week

## 2015-08-21 ENCOUNTER — Encounter: Payer: Self-pay | Admitting: Obstetrics & Gynecology

## 2015-08-21 ENCOUNTER — Ambulatory Visit (INDEPENDENT_AMBULATORY_CARE_PROVIDER_SITE_OTHER): Payer: BC Managed Care – PPO | Admitting: Obstetrics & Gynecology

## 2015-08-21 VITALS — BP 108/75 | HR 55 | Resp 16 | Wt 202.0 lb

## 2015-08-21 DIAGNOSIS — K521 Toxic gastroenteritis and colitis: Secondary | ICD-10-CM | POA: Diagnosis not present

## 2015-08-21 DIAGNOSIS — N72 Inflammatory disease of cervix uteri: Secondary | ICD-10-CM

## 2015-08-21 DIAGNOSIS — T283XXS Burn of internal genitourinary organs, sequela: Secondary | ICD-10-CM

## 2015-08-21 NOTE — Progress Notes (Signed)
   Subjective:    Patient ID: Rebekah Hicks, female    DOB: Sep 06, 1986, 29 y.o.   MRN: [id number]  HPI  29 yo female present for cervicitis post Cryo.  Some bleeding, no odor, no pain.  Pt has taken 1/2 of antibioitcs.  It is causing some diarhea.  Not watery or profuse.  Review of Systems  Constitutional: Negative.   Gastrointestinal: Positive for diarrhea.  Genitourinary: Positive for vaginal bleeding. Negative for vaginal discharge and vaginal pain.       Objective:   Physical Exam  Constitutional: She appears well-developed and well-nourished. No distress.  HENT:  Head: Normocephalic and atraumatic.  Abdominal: Soft.  Genitourinary:  Tanner V Vulva: no lesoin Vagina: no blood no lesion Cervix:  Musch improved.  Markedly less necrotic tissue and some pink epithelium present.  No odor  Skin: Skin is warm and dry.  Vitals reviewed.         Assessment & Plan:  29 yo female with cervicitis s/p cryo  1-Continue Cipro Flagyl 2-Start probiotic for diarrhea.  Call if worsens. 3-RTC 2 weeks

## 2015-09-03 ENCOUNTER — Ambulatory Visit (INDEPENDENT_AMBULATORY_CARE_PROVIDER_SITE_OTHER): Payer: BC Managed Care – PPO | Admitting: Obstetrics & Gynecology

## 2015-09-03 ENCOUNTER — Encounter: Payer: Self-pay | Admitting: Obstetrics & Gynecology

## 2015-09-03 VITALS — BP 111/69 | HR 63 | Resp 16 | Ht 67.0 in | Wt 203.0 lb

## 2015-09-03 DIAGNOSIS — N871 Moderate cervical dysplasia: Secondary | ICD-10-CM | POA: Diagnosis not present

## 2015-09-03 DIAGNOSIS — N72 Inflammatory disease of cervix uteri: Secondary | ICD-10-CM | POA: Diagnosis not present

## 2015-09-03 DIAGNOSIS — T283XXS Burn of internal genitourinary organs, sequela: Secondary | ICD-10-CM

## 2015-09-04 NOTE — Progress Notes (Signed)
   Subjective:    Patient ID: Rebekah Hicks, female    DOB: October 17, 1985, 29 y.o.   MRN: [id number]  HPI  Pt her for f/u from cervicitis and infection after cryotherapy.  Pt finished antibiotics.  She denies pain, discharge, and bleeding.  Pt has not had intercourse.    Review of Systems  Constitutional: Negative.   Respiratory: Negative.   Cardiovascular: Negative.   Gastrointestinal: Negative.   Genitourinary: Negative.        Objective:   Physical Exam  Constitutional: She appears well-developed and well-nourished. No distress.  HENT:  Head: Normocephalic and atraumatic.  Eyes: Conjunctivae are normal.  Pulmonary/Chest: Effort normal.  Genitourinary: Vagina normal. No vaginal discharge found.  Cervix well healed  No CMT  Skin: Skin is warm and dry.  Psychiatric: She has a normal mood and affect.          Assessment & Plan:  Well healed cervix after cryo Per ASCCP guidelines, pt should have cotesting at 1 year.  TZ was large so opting to do at 6 months.

## 2016-02-19 ENCOUNTER — Encounter: Payer: Self-pay | Admitting: Family Medicine

## 2016-02-19 ENCOUNTER — Ambulatory Visit (INDEPENDENT_AMBULATORY_CARE_PROVIDER_SITE_OTHER): Payer: BC Managed Care – PPO | Admitting: Family Medicine

## 2016-02-19 VITALS — BP 101/68 | HR 86 | Temp 99.5°F | Wt 196.0 lb

## 2016-02-19 DIAGNOSIS — J020 Streptococcal pharyngitis: Secondary | ICD-10-CM | POA: Diagnosis not present

## 2016-02-19 MED ORDER — AMOXICILLIN 500 MG PO CAPS: 500.0000 mg | ORAL_CAPSULE | Freq: Three times a day (TID) | ORAL | Status: DC

## 2016-02-19 NOTE — Progress Notes (Signed)
CC: Rebekah Hicks is a 30 y.o. female is here for Sore Throat; Fever; Chills; and Nasal Congestion   Subjective: HPI:  Yesterday she began to feel a sore throat and feverish. This morning she checked her temperature because she was feeling achy and it was 101.2. She's taking Tylenol which is somewhat helpful. She felt bad enough this afternoon where she left work early. She's been around multiple sick contacts many students with strep throat. She denies difficulty swallowing or rash. She denies any headache nor cough.   Review Of Systems Outlined In HPI  Past Medical History  Diagnosis Date  . Vaginal Pap smear, abnormal     Past Surgical History  Procedure Laterality Date  . No past surgeries     Family History  Problem Relation Age of Onset  . Heart attack Father   . Hypertension Father   . Hyperlipidemia Father   . Diabetes Father   . Diabetes      grandparent    Social History   Social History  . Marital Status: Single    Spouse Name: N/A  . Number of Children: N/A  . Years of Education: N/A   Occupational History  . 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 Smoking status: Never Smoker   . Smokeless tobacco: Never Used  . Alcohol Use: 0.6 oz/week    1 Standard drinks or equivalent per week  . Drug Use: No  . Sexual Activity:    Partners: Male    Birth Control/ Protection: Pill   Other Topics Concern  . Not on file   Social History Narrative   Regular exercise.  occ caffeine.      Objective: BP 101/68 mmHg  Pulse 86  Temp(Src) 99.5 F (37.5 C) (Oral)  Wt 196 lb (88.905 kg)  SpO2 99%  General: Alert and Oriented, No Acute Distress HEENT: Pupils equal, round, reactive to light. Conjunctivae clear.  External ears unremarkable, canals clear with intact TMs with appropriate landmarks.  Middle ear appears open without effusion. Pink inferior turbinates.  Moist mucous membranes with a midline uvula and mild tonsillar exudates.  Mild anterior chain lymphadenopathy Lungs: Clear, comfortable work of breathing Cardiac: Regular rate and rhythm. Normal S1/S2.  No murmurs, rubs, nor gallops.   Extremities: No peripheral edema.  Strong peripheral pulses.  Mental Status: No depression, anxiety, nor agitation. Skin: Warm and dry.  Assessment & Plan: [name] was seen today for sore throat, fever, chills and nasal congestion.  Diagnoses and all orders for this visit:  Strep sore throat -     amoxicillin (AMOXIL) 500 MG capsule; Take 1 capsule (500 mg total) by mouth 3 (three) times daily.   High suspicion for strep throat especially with multiple sick contacts therefore start amoxicillin stay at work for at least the next 24 hours.  Return if symptoms worsen or fail to improve.

## 2016-02-29 ENCOUNTER — Other Ambulatory Visit: Payer: Self-pay | Admitting: Family Medicine

## 2016-03-13 ENCOUNTER — Encounter: Payer: Self-pay | Admitting: Obstetrics & Gynecology

## 2016-03-13 ENCOUNTER — Ambulatory Visit (INDEPENDENT_AMBULATORY_CARE_PROVIDER_SITE_OTHER): Payer: BC Managed Care – PPO | Admitting: Obstetrics & Gynecology

## 2016-03-13 VITALS — BP 114/74 | HR 79 | Resp 16 | Ht 67.0 in | Wt 198.0 lb

## 2016-03-13 DIAGNOSIS — Z760 Encounter for issue of repeat prescription: Secondary | ICD-10-CM

## 2016-03-13 DIAGNOSIS — Z1151 Encounter for screening for human papillomavirus (HPV): Secondary | ICD-10-CM | POA: Diagnosis not present

## 2016-03-13 DIAGNOSIS — Z3041 Encounter for surveillance of contraceptive pills: Secondary | ICD-10-CM | POA: Diagnosis not present

## 2016-03-13 DIAGNOSIS — Z124 Encounter for screening for malignant neoplasm of cervix: Secondary | ICD-10-CM | POA: Diagnosis not present

## 2016-03-13 DIAGNOSIS — N871 Moderate cervical dysplasia: Secondary | ICD-10-CM | POA: Diagnosis not present

## 2016-03-13 MED ORDER — DESOGESTREL-ETHINYL ESTRADIOL 0.15-0.02/0.01 MG (21/5) PO TABS: 1.0000 | ORAL_TABLET | Freq: Every day | ORAL | Status: DC

## 2016-03-13 NOTE — Addendum Note (Signed)
Addended by: Granville LewisLARK, Jenilyn Magana L on: 03/13/2016 04:13 PM   Modules accepted: Orders

## 2016-03-13 NOTE — Progress Notes (Signed)
   Subjective:    Patient ID: Rebekah Hicks, female    DOB: July 05, 1986, 30 y.o.   MRN: [id number]  HPI  Pt here for rpt pap after cyro.  Pt had very large TZ so doing at 6 months.  Pt has no problems. Just graduated from graduate school--school counselor  Review of Systems  Constitutional: Negative.   Gastrointestinal: Negative.   Genitourinary: Negative.        Objective:   Physical Exam  Constitutional: She appears well-developed and well-nourished. No distress.  Abdominal: Soft.  Genitourinary: Vagina normal.  Normal appearing cervix, NT  Skin: Skin is warm and dry.  Psychiatric: She has a normal mood and affect.          Assessment & Plan:  30 yo female with CIN 2 s/p cryo. Pap with cotesting today. Continue OCPs

## 2016-03-17 LAB — CYTOLOGY - PAP

## 2016-03-31 ENCOUNTER — Telehealth: Payer: Self-pay | Admitting: *Deleted

## 2016-03-31 NOTE — Telephone Encounter (Signed)
Pt notified of pap results

## 2016-03-31 NOTE — Telephone Encounter (Signed)
-----   Message from Lesly DukesKelly H Leggett, MD sent at 03/31/2016  5:48 AM EDT ----- Low grade pap smear and HPV negative.  Evidence of fungal infection on pap.  Will rpt with cotesting in 1 year.  RN to call patient and note sent via my chart.

## 2016-05-27 ENCOUNTER — Encounter: Payer: Self-pay | Admitting: Physician Assistant

## 2016-05-27 ENCOUNTER — Ambulatory Visit (INDEPENDENT_AMBULATORY_CARE_PROVIDER_SITE_OTHER): Payer: BC Managed Care – PPO | Admitting: Physician Assistant

## 2016-05-27 VITALS — BP 128/74 | HR 58 | Ht 66.0 in | Wt 198.0 lb

## 2016-05-27 DIAGNOSIS — Z0000 Encounter for general adult medical examination without abnormal findings: Secondary | ICD-10-CM

## 2016-05-27 DIAGNOSIS — Z131 Encounter for screening for diabetes mellitus: Secondary | ICD-10-CM

## 2016-05-27 DIAGNOSIS — Z13220 Encounter for screening for lipoid disorders: Secondary | ICD-10-CM | POA: Diagnosis not present

## 2016-05-27 DIAGNOSIS — E669 Obesity, unspecified: Secondary | ICD-10-CM | POA: Diagnosis not present

## 2016-05-27 MED ORDER — PHENTERMINE HCL 37.5 MG PO TABS: 37.5000 mg | ORAL_TABLET | Freq: Every day | ORAL | 0 refills | Status: DC

## 2016-05-27 NOTE — Progress Notes (Signed)
Subjective:     Rebekah Hicks Rebekah Hicks is a 30 y.o. female and is here for a comprehensive physical exam. The patient reports no problems.  Social History   Social History  . Marital status: Single    Spouse name: N/A  . Number of children: N/A  . Years of education: N/A   Occupational History  . 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 Smoking status: Never Smoker  . Smokeless tobacco: Never Used  . Alcohol use 0.6 oz/week    1 Standard drinks or equivalent per week  . Drug use: No  . Sexual activity: Yes    Partners: Male    Birth control/ protection: Pill   Other Topics Concern  . Not on file   Social History Narrative   Regular exercise.  occ caffeine.    Health Maintenance  Topic Date Due  . INFLUENZA VACCINE  05/13/2016  . PAP SMEAR  03/14/2019  . TETANUS/TDAP  05/06/2021  . HIV Screening  Completed    The following portions of the patient's history were reviewed and updated as appropriate: allergies, current medications, past family history, past medical history, past social history, past surgical history and problem list.  Review of Systems A comprehensive review of systems was negative.   Objective:    BP 128/74   Pulse (!) 58   Ht 5\' 6"  (1.676 m)   Wt 198 lb (89.8 kg)   BMI 31.96 kg/m  General appearance: alert, cooperative and appears stated age Head: Normocephalic, without obvious abnormality, atraumatic Eyes: conjunctivae/corneas clear. PERRL, EOM's intact. Fundi benign. Ears: normal TM's and external ear canals both ears Nose: Nares normal. Septum midline. Mucosa normal. No drainage or sinus tenderness. Throat: lips, mucosa, and tongue normal; teeth and gums normal Neck: no adenopathy, no carotid bruit, no JVD, supple, symmetrical, trachea midline and thyroid not enlarged, symmetric, no tenderness/mass/nodules Back: symmetric, no curvature. ROM normal. No CVA tenderness. Lungs: clear to auscultation bilaterally Heart:  regular rate and rhythm, S1, S2 normal, no murmur, click, rub or gallop Abdomen: soft, non-tender; bowel sounds normal; no masses,  no organomegaly Extremities: extremities normal, atraumatic, no cyanosis or edema Pulses: 2+ and symmetric Skin: Skin color, texture, turgor normal. No rashes or lesions Lymph nodes: Cervical, supraclavicular, and axillary nodes normal. Neurologic: Alert and oriented X 3, normal strength and tone. Normal symmetric reflexes. Normal coordination and gait    Assessment:    Healthy female exam.      Plan:  CPE- pap done at GYN, normal. Lipid and cmp ordered today. Discussed vitamin d 800 units and calcium 1500mg .   Obesity- discussed options. Started phentermine. Discussed side effects. Discussed diet and exercise. 1500 calorie diet recommended.    See After Visit Summary for Counseling Recommendations

## 2016-05-27 NOTE — Patient Instructions (Signed)

## 2016-05-30 LAB — COMPLETE METABOLIC PANEL WITH GFR
ALT: 18 U/L (ref 6–29)
AST: 19 U/L (ref 10–30)
Albumin: 4.3 g/dL (ref 3.6–5.1)
Alkaline Phosphatase: 54 U/L (ref 33–115)
BUN: 8 mg/dL (ref 7–25)
CALCIUM: 9.6 mg/dL (ref 8.6–10.2)
CHLORIDE: 105 mmol/L (ref 98–110)
CO2: 25 mmol/L (ref 20–31)
Creat: 0.96 mg/dL (ref 0.50–1.10)
GFR, EST NON AFRICAN AMERICAN: 80 mL/min (ref >=60)
Glucose, Bld: 76 mg/dL (ref 65–99)
POTASSIUM: 5 mmol/L (ref 3.5–5.3)
Sodium: 139 mmol/L (ref 135–146)
Total Bilirubin: 0.4 mg/dL (ref 0.2–1.2)
Total Protein: 7.1 g/dL (ref 6.1–8.1)

## 2016-05-30 LAB — LIPID PANEL
CHOL/HDL RATIO: 2.5 ratio (ref <=5.0)
CHOLESTEROL: 220 mg/dL — AB (ref 125–200)
HDL: 87 mg/dL (ref >=46)
LDL CALC: 116 mg/dL (ref <130)
TRIGLYCERIDES: 86 mg/dL (ref <150)
VLDL: 17 mg/dL (ref <30)

## 2016-07-06 ENCOUNTER — Other Ambulatory Visit: Payer: Self-pay | Admitting: Family Medicine

## 2016-12-17 ENCOUNTER — Other Ambulatory Visit: Payer: Self-pay | Admitting: Obstetrics & Gynecology

## 2016-12-17 DIAGNOSIS — Z760 Encounter for issue of repeat prescription: Secondary | ICD-10-CM

## 2017-01-07 ENCOUNTER — Encounter: Payer: Self-pay | Admitting: Physician Assistant

## 2017-01-07 ENCOUNTER — Ambulatory Visit (INDEPENDENT_AMBULATORY_CARE_PROVIDER_SITE_OTHER): Payer: BC Managed Care – PPO | Admitting: Physician Assistant

## 2017-01-07 VITALS — BP 124/79 | HR 92 | Temp 97.8°F | Ht 66.0 in | Wt 196.0 lb

## 2017-01-07 DIAGNOSIS — J320 Chronic maxillary sinusitis: Secondary | ICD-10-CM

## 2017-01-07 DIAGNOSIS — J040 Acute laryngitis: Secondary | ICD-10-CM

## 2017-01-07 MED ORDER — PREDNISONE 50 MG PO TABS
ORAL_TABLET | ORAL | 0 refills | Status: DC
Start: 2017-01-07 — End: 2017-05-14

## 2017-01-07 MED ORDER — IPRATROPIUM BROMIDE 0.06 % NA SOLN: 2.0000 | Freq: Four times a day (QID) | NASAL | 1 refills | Status: DC

## 2017-01-07 MED ORDER — AMOXICILLIN-POT CLAVULANATE 875-125 MG PO TABS
1.0000 | ORAL_TABLET | Freq: Two times a day (BID) | ORAL | 0 refills | Status: DC
Start: 2017-01-07 — End: 2017-05-14

## 2017-01-07 NOTE — Progress Notes (Signed)
   Subjective:    Patient ID: Rebekah Hicks, female    DOB: [birth year], 31 y.o.   MRN: [id number]  HPI  Pt is a 31 yo female who presents to the clinic with sore throat, sinus pressure, upper left teeth pain 6 days. Yesterday she lost her voice. She is taking tylenol cold and sinus and does help until it runs out of her system. No fever, chills, body aches, SOB, ear pain. She does have a mildly productive cough.    Review of Systems    see HPI.  Objective:   Physical Exam  Constitutional: She is oriented to person, place, and time. She appears well-developed and well-nourished.  HENT:  Head: Normocephalic and atraumatic.  Right Ear: External ear normal.  Left Ear: External ear normal.  TM's erythematous.  Tenderness over left maxillary sinus.  Nasal turbinates red and swollen.  Oropharynx red with PND.   Eyes: Conjunctivae are normal. Right eye exhibits no discharge. Left eye exhibits no discharge.  Neck: Normal range of motion. Neck supple.  Cardiovascular: Normal rate, regular rhythm and normal heart sounds.   Pulmonary/Chest: Effort normal and breath sounds normal.  Lymphadenopathy:    She has cervical adenopathy.  Neurological: She is alert and oriented to person, place, and time.  Psychiatric: She has a normal mood and affect. Her behavior is normal.          Assessment & Plan:  Marland Kitchen.Marland Kitchen.Rebekah Hicks was seen today for sore throat and laryngitis.  Diagnoses and all orders for this visit:  Laryngitis -     predniSONE (DELTASONE) 50 MG tablet; Take one tablet for 5 days. -     ipratropium (ATROVENT) 0.06 % nasal spray; Place 2 sprays into both nostrils 4 (four) times daily.  Left maxillary sinusitis -     ipratropium (ATROVENT) 0.06 % nasal spray; Place 2 sprays into both nostrils 4 (four) times daily. -     amoxicillin-clavulanate (AUGMENTIN) 875-125 MG tablet; Take 1 tablet by mouth 2 (two) times daily. For 10 days.   Start with prednisone and atrovent. If not improving  and sinus pressure worsening due to holiday weekend printed out RX for Augmentin.  desylm for cough.  Symptoms care discussed. Follow up as needed.

## 2017-01-07 NOTE — Patient Instructions (Signed)
Hoarseness Hoarseness is any abnormal change in your voice.Hoarseness can make it difficult to speak. Your voice may sound raspy, breathy, or strained. Hoarseness is caused by a problem with the vocal cords. The vocal cords are two bands of tissue inside your voice box (larynx). When you speak, your vocal cords move back and forth to create sound. The surfaces of your vocal cords need to be smooth for your voice to sound clear. Swelling or lumps on the vocal cords can cause hoarseness. Common causes of vocal cord problems include:  Upper airway infection.  A long-term cough.  Straining or overusing your voice.  Smoking.  Allergies.  Vocal cord growths.  Stomach acids that flow up from your stomach and irritate your vocal cords (gastroesophageal reflux). Follow these instructions at home: Watch your condition for any changes. To ease any discomfort that you feel:  Rest your voice. Do not whisper. Whispering can cause muscle strain.  Do not speak in a loud or harsh voice that makes your hoarseness worse.  Do not use any tobacco products, including cigarettes, chewing tobacco, or electronic cigarettes. If you need help quitting, ask your health care provider.  Avoid secondhand smoke.  Do not eat foods that give you heartburn. Heartburn can make gastroesophageal reflux worse.  Do not drink coffee.  Do not drink alcohol.  Drink enough fluids to keep your urine clear or pale yellow.  Use a humidifier if the air in your home is dry. Contact a health care provider if:  You have hoarseness that lasts longer than 3 weeks.  You almost lose or completelylose your voice for longer than 3 days.  You have pain when you swallow or try to talk.  You feel a lump in your neck. Get help right away if:  You have trouble swallowing.  You feel as though you are choking when you swallow.  You cough up blood or vomit blood.  You have trouble breathing. This information is not  intended to replace advice given to you by your health care provider. Make sure you discuss any questions you have with your health care provider. Document Released: 09/12/2005 Document Revised: 03/06/2016 Document Reviewed: 09/20/2014 Elsevier Interactive Patient Education  2017 [employer]  

## 2017-01-08 ENCOUNTER — Encounter: Payer: Self-pay | Admitting: Physician Assistant

## 2017-05-14 ENCOUNTER — Ambulatory Visit (INDEPENDENT_AMBULATORY_CARE_PROVIDER_SITE_OTHER): Payer: BC Managed Care – PPO | Admitting: Obstetrics & Gynecology

## 2017-05-14 ENCOUNTER — Encounter: Payer: Self-pay | Admitting: Obstetrics & Gynecology

## 2017-05-14 VITALS — BP 99/65 | HR 74 | Resp 16 | Ht 67.0 in | Wt 203.0 lb

## 2017-05-14 DIAGNOSIS — Z1151 Encounter for screening for human papillomavirus (HPV): Secondary | ICD-10-CM | POA: Diagnosis not present

## 2017-05-14 DIAGNOSIS — Z124 Encounter for screening for malignant neoplasm of cervix: Secondary | ICD-10-CM

## 2017-05-14 DIAGNOSIS — Z01419 Encounter for gynecological examination (general) (routine) without abnormal findings: Secondary | ICD-10-CM

## 2017-05-14 MED ORDER — NORGESTIMATE-ETH ESTRADIOL 0.25-35 MG-MCG PO TABS: 1.0000 | ORAL_TABLET | Freq: Every day | ORAL | 11 refills | Status: DC

## 2017-05-14 NOTE — Progress Notes (Signed)
Subjective:     Rebekah Hicks [name] is a 31 y.o. female here for a routine exam.  Current complaints: wants OCP that has zero co-pay.     Gynecologic History Patient's last menstrual period was 05/06/2017. Contraception: OCP (estrogen/progesterone) Last Pap: 2017. Results were: normal  Obstetric History OB History  Gravida Para Term Preterm AB Living  0 0 0 0 0 0  SAB TAB Ectopic Multiple Live Births  0 0 0 0           The following portions of the patient's history were reviewed and updated as appropriate: allergies, current medications, past family history, past medical history, past social history, past surgical history and problem list.  Review of Systems Pertinent items noted in HPI and remainder of comprehensive ROS otherwise negative.    Objective:      Vitals:   05/14/17 1320  BP: 99/65  Pulse: 74  Resp: 16  Weight: 203 lb (92.1 kg)  Height: 5\' 7"  (1.702 m)   Vitals:  WNL General appearance: alert, cooperative and no distress  HEENT: Normocephalic, without obvious abnormality, atraumatic Eyes: negative Throat: lips, mucosa, and tongue normal; teeth and gums normal  Respiratory: Clear to auscultation bilaterally  CV: Regular rate and rhythm  Breasts:  Normal appearance, no masses or tenderness, no nipple retraction or dimpling  GI: Soft, non-tender; bowel sounds normal; no masses,  no organomegaly  GU: External Genitalia:  Tanner V, no lesion Urethra:  No prolapse   Vagina: Pink, normal rugae, no blood or discharge  Cervix: No CMT, no lesion  Uterus:  Normal size and contour, non tender  Adnexa: Normal, no masses, non tender  Musculoskeletal: No edema, redness or tenderness in the calves or thighs  Skin: No lesions or rash  Lymphatic: Axillary adenopathy: none     Psychiatric: Normal mood and behavior        Assessment:    Healthy female exam.    Plan:   Pap with co testing Sprintec OCPs ordered.  Pt will call if there is more financially  favorable pill on her plan.  Warned about spotting with change in formulations.

## 2017-05-19 LAB — CYTOLOGY - PAP
Adequacy: ABSENT
Diagnosis: NEGATIVE
HPV: NOT DETECTED

## 2017-12-14 ENCOUNTER — Ambulatory Visit: Payer: BC Managed Care – PPO | Admitting: Physician Assistant

## 2017-12-14 ENCOUNTER — Encounter: Payer: Self-pay | Admitting: Physician Assistant

## 2017-12-14 VITALS — BP 102/56 | HR 73 | Temp 98.3°F | Ht 67.0 in | Wt 206.0 lb

## 2017-12-14 DIAGNOSIS — J020 Streptococcal pharyngitis: Secondary | ICD-10-CM | POA: Diagnosis not present

## 2017-12-14 DIAGNOSIS — R509 Fever, unspecified: Secondary | ICD-10-CM

## 2017-12-14 LAB — POCT RAPID STREP A (OFFICE): Rapid Strep A Screen: POSITIVE — AB

## 2017-12-14 MED ORDER — PENICILLIN V POTASSIUM 500 MG PO TABS: 500.0000 mg | ORAL_TABLET | Freq: Two times a day (BID) | ORAL | 0 refills | Status: DC

## 2017-12-14 NOTE — Patient Instructions (Signed)
Strep Throat Strep throat is a bacterial infection of the throat. Your health care provider may call the infection tonsillitis or pharyngitis, depending on whether there is swelling in the tonsils or at the back of the throat. Strep throat is most common during the cold months of the year in children who are 5-32 years of age, but it can happen during any season in people of any age. This infection is spread from person to person (contagious) through coughing, sneezing, or close contact. What are the causes? Strep throat is caused by the bacteria called Streptococcus pyogenes. What increases the risk? This condition is more likely to develop in:  People who spend time in crowded places where the infection can spread easily.  People who have close contact with someone who has strep throat.  What are the signs or symptoms? Symptoms of this condition include:  Fever or chills.  Redness, swelling, or pain in the tonsils or throat.  Pain or difficulty when swallowing.  White or yellow spots on the tonsils or throat.  Swollen, tender glands in the neck or under the jaw.  Red rash all over the body (rare).  How is this diagnosed? This condition is diagnosed by performing a rapid strep test or by taking a swab of your throat (throat culture test). Results from a rapid strep test are usually ready in a few minutes, but throat culture test results are available after one or two days. How is this treated? This condition is treated with antibiotic medicine. Follow these instructions at home: Medicines  Take over-the-counter and prescription medicines only as told by your health care provider.  Take your antibiotic as told by your health care provider. Do not stop taking the antibiotic even if you start to feel better.  Have family members who also have a sore throat or fever tested for strep throat. They may need antibiotics if they have the strep infection. Eating and drinking  Do not  share food, drinking cups, or personal items that could cause the infection to spread to other people.  If swallowing is difficult, try eating soft foods until your sore throat feels better.  Drink enough fluid to keep your urine clear or pale yellow. General instructions  Gargle with a salt-water mixture 3-4 times per day or as needed. To make a salt-water mixture, completely dissolve -1 tsp of salt in 1 cup of warm water.  Make sure that all household members wash their hands well.  Get plenty of rest.  Stay home from school or work until you have been taking antibiotics for 24 hours.  Keep all follow-up visits as told by your health care provider. This is important. Contact a health care provider if:  The glands in your neck continue to get bigger.  You develop a rash, cough, or earache.  You cough up a thick liquid that is green, yellow-brown, or bloody.  You have pain or discomfort that does not get better with medicine.  Your problems seem to be getting worse rather than better.  You have a fever. Get help right away if:  You have new symptoms, such as vomiting, severe headache, stiff or painful neck, chest pain, or shortness of breath.  You have severe throat pain, drooling, or changes in your voice.  You have swelling of the neck, or the skin on the neck becomes red and tender.  You have signs of dehydration, such as fatigue, dry mouth, and decreased urination.  You become increasingly sleepy, or   you cannot wake up completely.  Your joints become red or painful. This information is not intended to replace advice given to you by your health care provider. Make sure you discuss any questions you have with your health care provider. Document Released: 09/26/2000 Document Revised: 05/28/2016 Document Reviewed: 01/22/2015 Elsevier Interactive Patient Education  2018 [employer] Inc[employer]  

## 2017-12-14 NOTE — Progress Notes (Signed)
   Subjective:    Patient ID: Rebekah Hicks, female    DOB: 1986/02/25, 32 y.o.   MRN: [id number]  HPI Patient is a 32 year old female who presents to the clinic with 1 day of sore throat.  She has a history of strep throat.  She usually knows when she gets it.  She had a high fever yesterday but does not have a fever today.  She is taking occasional ibuprofen and Tylenol as needed.  She has not tried anything else to make better.  She denies any cough, body aches, chills, sinus pressure, ear pain, wheezing, shortness of breath.  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  .. Active Ambulatory Problems    Diagnosis Date Noted  . MIGRAINE, MENSTRUAL 05/06/2010  . WEIGHT GAIN 05/06/2010  . Hyperlipidemia, mixed 05/23/2013  . Painful intercourse 02/22/2014  . Postcoital bleeding 02/22/2014  . Dysplasia of cervix, high grade CIN 2 06/29/2015   Resolved Ambulatory Problems    Diagnosis Date Noted  . ASCUS with positive high risk HPV 06/26/2015   Past Medical History:  Diagnosis Date  . Migraine   . Vaginal Pap smear, abnormal       Review of Systems  All other systems reviewed and are negative.      Objective:   Physical Exam  Constitutional: She is oriented to person, place, and time. She appears well-developed and well-nourished.  HENT:  Head: Normocephalic and atraumatic.  Right Ear: External ear normal.  Left Ear: External ear normal.  Nose: Nose normal.  TM's clear bilaterally.  Bilateral hypertrophic tonsils with white exudate bilaterally. Erythematous oropharynx. Uvula midline.   Eyes: Conjunctivae are normal. Right eye exhibits no discharge. Left eye exhibits no discharge.  Neck: Normal range of motion. Neck supple.  Cardiovascular: Normal rate, regular rhythm and normal heart sounds.  Lymphadenopathy:    She has no cervical adenopathy.  Neurological: She is alert and oriented to person, place, and time.  Psychiatric: She has a  normal mood and affect. Her behavior is normal.          Assessment & Plan:  Marland Kitchen.Marland Kitchen.[name] was seen today for sore throat and fever.  Diagnoses and all orders for this visit:  Strep throat -     POCT rapid strep A -     penicillin v potassium (VEETID) 500 MG tablet; Take 1 tablet (500 mg total) by mouth 2 (two) times daily. For 10 days.  Fever, unspecified fever cause -     POCT rapid strep A  .. Results for orders placed or performed in visit on 12/14/17  POCT rapid strep A  Result Value Ref Range   Rapid Strep A Screen Positive (A) Negative    Pt opted for treatment with oral medication. Sent PCN for 10 days. HO given. Discussed prevention from reoccurrence. Written out for 24 hours. RTW tomorrow.

## 2018-03-11 ENCOUNTER — Other Ambulatory Visit: Payer: Self-pay | Admitting: Obstetrics & Gynecology

## 2018-06-04 ENCOUNTER — Other Ambulatory Visit: Payer: Self-pay | Admitting: Obstetrics & Gynecology

## 2018-07-14 ENCOUNTER — Encounter: Payer: Self-pay | Admitting: Physician Assistant

## 2018-07-14 ENCOUNTER — Ambulatory Visit (INDEPENDENT_AMBULATORY_CARE_PROVIDER_SITE_OTHER): Payer: BC Managed Care – PPO | Admitting: Physician Assistant

## 2018-07-14 VITALS — BP 121/69 | HR 67 | Ht 67.0 in | Wt 204.0 lb

## 2018-07-14 DIAGNOSIS — Z0000 Encounter for general adult medical examination without abnormal findings: Secondary | ICD-10-CM | POA: Diagnosis not present

## 2018-07-14 DIAGNOSIS — Z23 Encounter for immunization: Secondary | ICD-10-CM

## 2018-07-14 DIAGNOSIS — Z13220 Encounter for screening for lipoid disorders: Secondary | ICD-10-CM | POA: Diagnosis not present

## 2018-07-14 DIAGNOSIS — Z131 Encounter for screening for diabetes mellitus: Secondary | ICD-10-CM | POA: Diagnosis not present

## 2018-07-14 DIAGNOSIS — Z139 Encounter for screening, unspecified: Secondary | ICD-10-CM | POA: Diagnosis not present

## 2018-07-14 DIAGNOSIS — Z7184 Encounter for health counseling related to travel: Secondary | ICD-10-CM

## 2018-07-14 NOTE — Progress Notes (Signed)
Subjective:     Rebekah Hicks is a 32 y.o. female and is here for a comprehensive physical exam. The patient reports problems - pt is traveling to [place] in the spring and wants to be up to date on vaccines for travel. she has had all her childhood vaccines. .  Social History   Socioeconomic History  . Marital status: Single    Spouse name: Not on file  . Number of children: Not on file  . Years of education: Not on file  . Highest education level: Not on file  Occupational History  . 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 Financial resource strain: Not on file  . Food insecurity:    Worry: Not on file    Inability: Not on file  . Transportation needs:    Medical: Not on file    Non-medical: Not on file  Tobacco Use  . Smoking status: Never Smoker  . Smokeless tobacco: Never Used  Substance and Sexual Activity  . Alcohol use: Yes    Alcohol/week: 1.0 standard drinks    Types: 1 Standard drinks or equivalent per week  . Drug use: No  . Sexual activity: Yes    Partners: Male    Birth control/protection: Pill  Lifestyle  . Physical activity:    Days per week: Not on file    Minutes per session: Not on file  . Stress: Not on file  Relationships  . Social connections:    Talks on phone: Not on file    Gets together: Not on file    Attends religious service: Not on file    Active member of club or organization: Not on file    Attends meetings of clubs or organizations: Not on file    Relationship status: Not on file  . Intimate partner violence:    Fear of current or ex partner: Not on file    Emotionally abused: Not on file    Physically abused: Not on file    Forced sexual activity: Not on file  Other Topics Concern  . Not on file  Social History Narrative   Regular exercise.  occ caffeine.    Health Maintenance  Topic Date Due  . INFLUENZA VACCINE  10/13/2018 (Originally 05/13/2018)  . PAP SMEAR  05/14/2020  .  TETANUS/TDAP  05/06/2021  . HIV Screening  Completed    The following portions of the patient's history were reviewed and updated as appropriate: allergies, current medications, past family history, past medical history, past social history, past surgical history and problem list.  Review of Systems A comprehensive review of systems was negative.   Objective:    BP 121/69   Pulse 67   Ht 5\' 7"  (1.702 m)   Wt 204 lb (92.5 kg)   BMI 31.95 kg/m  General appearance: alert, cooperative, appears stated age and mildly obese Head:  Normocephalic, without obvious abnormality, atraumatic Eyes: conjunctivae/corneas clear. PERRL, EOM's intact. Fundi benign. Ears: normal TM's and external ear canals both ears Nose: Nares normal. Septum midline. Mucosa normal. No drainage or sinus tenderness. Throat: lips, mucosa, and tongue normal; teeth and gums normal Neck: no adenopathy, no carotid bruit, no JVD, supple, symmetrical, trachea midline and thyroid not enlarged, symmetric, no tenderness/mass/nodules Back: symmetric, no curvature. ROM normal. No CVA tenderness. Lungs: clear to auscultation bilaterally Heart: regular rate and rhythm, S1, S2 normal, no murmur, click, rub or gallop Abdomen: soft, non-tender; bowel sounds  normal; no masses,  no organomegaly Extremities: extremities normal, atraumatic, no cyanosis or edema Pulses: 2+ and symmetric Skin: Skin color, texture, turgor normal. No rashes or lesions Lymph nodes: Cervical, supraclavicular, and axillary nodes normal. Neurologic: Alert and oriented X 3, normal strength and tone. Normal symmetric reflexes. Normal coordination and gait    Assessment:    Healthy female exam.      Plan:    Rebekah Hicks KitchenParlee was seen today for annual exam.  Diagnoses and all orders for this visit:  Routine physical examination -     COMPLETE METABOLIC PANEL WITH GFR -     Lipid Panel w/reflex Direct LDL  Screening for diabetes mellitus -     COMPLETE METABOLIC  PANEL WITH GFR  Screening for lipid disorders -     Lipid Panel w/reflex Direct LDL  Counseling about travel  Need for hepatitis A vaccination -     Hepatitis A vaccine adult IM   .Rebekah Kitchen Depression screen PHQ 2/9 07/14/2018  Decreased Interest 0  Down, Depressed, Hopeless 0  PHQ - 2 Score 0  Altered sleeping 0  Tired, decreased energy 0  Change in appetite 0  Feeling bad or failure about yourself  0  Trouble concentrating 0  Moving slowly or fidgety/restless 0  Suicidal thoughts 0  PHQ-9 Score 0  Difficult doing work/chores Not difficult at all   .Rebekah Kitchen Discussed 150 minutes of exercise a week.  Encouraged vitamin D 1000 units and Calcium 1300mg  or 4 servings of dairy a day.  Pap up to date and see GYN.  Fasting labs ordered.   Rebekah Kitchen.Discussed low carb diet with 1500 calories and 80g of protein.  Exercising at least 150 minutes a week.  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   Discussed travel to [place]. Pt declined flu shot. Hep A given today. Discussed malaria and tyhoid prevention. She will let me know. Printed off recommend vaccines.  See After Visit Summary for Counseling Recommendations

## 2018-07-14 NOTE — Patient Instructions (Signed)

## 2019-06-28 ENCOUNTER — Other Ambulatory Visit: Payer: Self-pay

## 2019-06-28 ENCOUNTER — Other Ambulatory Visit: Payer: Self-pay | Admitting: Student

## 2019-06-28 ENCOUNTER — Encounter: Payer: Self-pay | Admitting: Certified Nurse Midwife

## 2019-06-28 ENCOUNTER — Ambulatory Visit (INDEPENDENT_AMBULATORY_CARE_PROVIDER_SITE_OTHER): Payer: BC Managed Care – PPO | Admitting: Certified Nurse Midwife

## 2019-06-28 ENCOUNTER — Other Ambulatory Visit: Payer: BC Managed Care – PPO

## 2019-06-28 VITALS — BP 113/72 | HR 76 | Wt 208.0 lb

## 2019-06-28 DIAGNOSIS — Z3490 Encounter for supervision of normal pregnancy, unspecified, unspecified trimester: Secondary | ICD-10-CM | POA: Insufficient documentation

## 2019-06-28 DIAGNOSIS — Z113 Encounter for screening for infections with a predominantly sexual mode of transmission: Secondary | ICD-10-CM | POA: Diagnosis not present

## 2019-06-28 DIAGNOSIS — O99211 Obesity complicating pregnancy, first trimester: Secondary | ICD-10-CM

## 2019-06-28 DIAGNOSIS — Z3A10 10 weeks gestation of pregnancy: Secondary | ICD-10-CM | POA: Diagnosis not present

## 2019-06-28 DIAGNOSIS — O99210 Obesity complicating pregnancy, unspecified trimester: Secondary | ICD-10-CM

## 2019-06-28 HISTORY — DX: Obesity complicating pregnancy, unspecified trimester: O99.210

## 2019-06-28 HISTORY — DX: Encounter for supervision of normal pregnancy, unspecified, unspecified trimester: Z34.90

## 2019-06-28 MED ORDER — ASPIRIN EC 81 MG PO TBEC: 81.0000 mg | DELAYED_RELEASE_TABLET | Freq: Every day | ORAL | 0 refills | Status: DC

## 2019-06-28 NOTE — Progress Notes (Signed)
Subjective:   Rebekah Hicks is a 33 y.o. G1P0000 at 14w6dby LMP, confirmed by early ultrasound being seen today for her first obstetrical visit. This is a planned pregnancy. Patient and FOB are very excited about this pregnancy. Her obstetrical history is significant for obesity. Patient does intend to breast feed. Pregnancy history fully reviewed.  Patient reports no complaints.  HISTORY: OB History  Gravida Para Term Preterm AB Living  1 0 0 0 0 0  SAB TAB Ectopic Multiple Live Births  0 0 0 0 0    # Outcome Date GA Lbr Len/2nd Weight Sex Delivery Anes PTL Lv  1 Current            Past Medical History:  Diagnosis Date  . Migraine   . Vaginal Pap smear, abnormal    Past Surgical History:  Procedure Laterality Date  . NO PAST SURGERIES     Family History  Problem Relation Age of Onset  . Heart attack Father   . Hypertension Father   . Hyperlipidemia Father   . Diabetes Father   . Hyperlipidemia Mother   . Hypertension Mother   . Diabetes Other        grandparent  . Diabetes Maternal Grandmother   . Alzheimer's disease Maternal Grandfather   . Heart attack Paternal Grandmother   . Stroke Paternal Grandmother    Social History   Tobacco Use  . Smoking status: Never Smoker  . Smokeless tobacco: Never Used  Substance Use Topics  . Alcohol use: Not Currently    Alcohol/week: 1.0 standard drinks    Types: 1 Standard drinks or equivalent per week  . Drug use: No   No Known Allergies Current Outpatient Medications on File Prior to Visit  Medication Sig Dispense Refill  . Prenatal Vit-Fe Fumarate-FA (PRENATAL VITAMIN PO) Take by mouth.     No current facility-administered medications on file prior to visit.     Indications for ASA therapy (per uptodate) One of the following: Previous pregnancy with preeclampsia, especially early onset and with an adverse outcome No Multifetal gestation No Chronic hypertension No Type 1 or 2 diabetes mellitus No  Chronic kidney disease No Autoimmune disease (antiphospholipid syndrome, systemic lupus erythematosus) No  Two or more of the following: Nulliparity Yes Obesity (body mass index >30 kg/m2) Yes Family history of preeclampsia in mother or sister No Age ?35 years No Sociodemographic characteristics (African American race, low socioeconomic level) No Personal risk factors (eg, previous pregnancy with low birth weight or small for gestational age infant, previous adverse pregnancy outcome [eg, stillbirth], interval >10 years between pregnancies) No  Indications for early 1 hour GTT (per uptodate)  BMI >25 (>23 in Asian women) AND one of the following  Gestational diabetes mellitus in a previous pregnancy No Glycated hemoglobin ?5.7 percent (39 mmol/mol), impaired glucose tolerance, or impaired fasting glucose on previous testing No First-degree relative with diabetes No High-risk race/ethnicity (eg, African American, Latino, Native American, [place], Pacific Islander) No History of cardiovascular disease No Hypertension or on therapy for hypertension No High-density lipoprotein cholesterol level <35 mg/dL (0.90 mmol/L) and/or a triglyceride level >250 mg/dL (2.82 mmol/L) No Polycystic ovary syndrome No Physical inactivity No Other clinical condition associated with insulin resistance (eg, severe obesity, acanthosis nigricans) No Previous birth of an infant weighing ?4000 g No Previous stillbirth of unknown cause No   Exam   Vitals:   06/28/19 1311  BP: 113/72  Pulse: 76  Weight: 94.3  kg   Fetal Heart Rate (bpm): 174  Uterus:     Pelvic Exam: Perineum: deferred   Vulva: deferred   Vagina:  deferred   Cervix: Deferred; pap smear not obtained    Adnexa: deferred   Bony Pelvis: average  System: General: well-developed, well-nourished female in no acute distress   Breast:  normal appearance, no masses or tenderness   Skin: normal coloration and turgor, no rashes    Neurologic: oriented, normal, negative, normal mood   Extremities: normal strength, tone, and muscle mass, ROM of all joints is normal   HEENT PERRLA, extraocular movement intact and sclera clear, anicteric   Mouth/Teeth mucous membranes moist, pharynx normal without lesions and dental hygiene good   Neck supple and no masses   Cardiovascular: regular rate and rhythm   Respiratory:  no respiratory distress, normal breath sounds   Abdomen: soft, non-tender; bowel sounds normal; no masses,  no organomegaly     Assessment:   Pregnancy: G1P0000 Patient Active Problem List   Diagnosis Date Noted  . Supervision of normal pregnancy 06/28/2019  . Obesity during pregnancy, antepartum 06/28/2019  . 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 Painful intercourse 02/22/2014  . Postcoital bleeding 02/22/2014  . Hyperlipidemia, mixed 05/23/2013  . MIGRAINE, MENSTRUAL 05/06/2010  . WEIGHT GAIN 05/06/2010     Plan:  1. Encounter for supervision of normal pregnancy, antepartum, unspecified gravidity - Pt here for NOB - Pt and FOB are very excited! - No complaints today - Routine labs today - Anatomy scan ordered - Anticipatory guidance provided to patient for upcoming appointments  - Obstetric panel - HIV antibody (with reflex) - Culture, OB Urine - Babyscripts Schedule Optimization - [place] MFM OB COMP + 14 WK; Future - Urine cytology ancillary only([facility])  2. Obesity during pregnancy, antepartum - Will draw A1C today, along with baseline CMP and PCR.  - Will send RX to pharmacy for BASA to be started at [redacted] weeks gestation  - HgB A1c - aspirin EC 81 MG tablet; Take 1 tablet (81 mg total) by mouth daily. Take after 12 weeks for prevention of preeclampsia later in pregnancy  Dispense: 300 tablet; Refill: 0 - Comp Met (CMET) - Protein / creatinine ratio, urine   Initial labs drawn. Start on ASA _0  weeks Continue prenatal vitamins. Genetic Screening discussed, NIPS:  requested. Needs to be approved by insurance. Ultrasound discussed; fetal anatomic survey: ordered. Problem list reviewed and updated The nature of [place] [facility] with multiple MDs and other Advanced Practice Providers was explained to patient; also emphasized that fellows, residents, and students are part of our team. Routine obstetric precautions reviewed No follow-ups on file.   Renee Harder, SNM 1:55 PM 06/28/19

## 2019-06-28 NOTE — Patient Instructions (Signed)
Safe Medications in Pregnancy  ° °Acne: °Benzoyl Peroxide °Salicylic Acid ° °Backache/Headache: °Tylenol: 2 regular strength every 4 hours OR °             2 Extra strength every 6 hours ° °Colds/Coughs/Allergies: °Benadryl (alcohol free) 25 mg every 6 hours as needed °Breath right strips °Claritin °Cepacol throat lozenges °Chloraseptic throat spray °Cold-Eeze- up to three times per day °Cough drops, alcohol free °Flonase (by prescription only) °Guaifenesin °Mucinex °Robitussin DM (plain only, alcohol free) °Saline nasal spray/drops °Sudafed (pseudoephedrine) & Actifed ** use only after [redacted] weeks gestation and if you do not have high blood pressure °Tylenol °Vicks Vaporub °Zinc lozenges °Zyrtec  ° °Constipation: °Colace °Ducolax suppositories °Fleet enema °Glycerin suppositories °Metamucil °Milk of magnesia °Miralax °Senokot °Smooth move tea ° °Diarrhea: °Kaopectate °Imodium A-D ° °*NO pepto Bismol ° °Hemorrhoids: °Anusol °Anusol HC °Preparation H °Tucks ° °Indigestion: °Tums °Maalox °Mylanta °Zantac  °Pepcid ° °Insomnia: °Benadryl (alcohol free) 25mg every 6 hours as needed °Tylenol PM °Unisom, no Gelcaps ° °Leg Cramps: °Tums °MagGel ° °Nausea/Vomiting:  °Bonine °Dramamine °Emetrol °Ginger extract °Sea bands °Meclizine  °Nausea medication to take during pregnancy:  °Unisom (doxylamine succinate 25 mg tablets) Take one tablet daily at bedtime. If symptoms are not adequately controlled, the dose can be increased to a maximum recommended dose of two tablets daily (1/2 tablet in the morning, 1/2 tablet mid-afternoon and one at bedtime). °Vitamin B6 100mg tablets. Take one tablet twice a day (up to 200 mg per day). ° °Skin Rashes: °Aveeno products °Benadryl cream or 25mg every 6 hours as needed °Calamine Lotion °1% cortisone cream ° °Yeast infection: °Gyne-lotrimin 7 °Monistat 7 ° ° °**If taking multiple medications, please check labels to avoid duplicating the same active ingredients °**take medication as directed on  the label °** Do not exceed 4000 mg of tylenol in 24 hours °**Do not take medications that contain aspirin or ibuprofen ° ° ° ° °First Trimester of Pregnancy ° °The first trimester of pregnancy is from week 1 until the end of week 13 (months 1 through 3). During this time, your baby will begin to develop inside you. At 6-8 weeks, the eyes and face are formed, and the heartbeat can be seen on ultrasound. At the end of 12 weeks, all the baby's organs are formed. Prenatal care is all the medical care you receive before the birth of your baby. Make sure you get good prenatal care and follow all of your doctor's instructions. °Follow these instructions at home: °Medicines °· Take over-the-counter and prescription medicines only as told by your doctor. Some medicines are safe and some medicines are not safe during pregnancy. °· Take a prenatal vitamin that contains at least 600 micrograms (mcg) of folic acid. °· If you have trouble pooping (constipation), take medicine that will make your stool soft (stool softener) if your doctor approves. °Eating and drinking ° °· Eat regular, healthy meals. °· Your doctor will tell you the amount of weight gain that is right for you. °· Avoid raw meat and uncooked cheese. °· If you feel sick to your stomach (nauseous) or throw up (vomit): °? Eat 4 or 5 small meals a day instead of 3 large meals. °? Try eating a few soda crackers. °? Drink liquids between meals instead of during meals. °· To prevent constipation: °? Eat foods that are high in fiber, like fresh fruits and vegetables, whole grains, and beans. °? Drink enough fluids to keep your pee (urine) clear or pale yellow. °  Activity °· Exercise only as told by your doctor. Stop exercising if you have cramps or pain in your lower belly (abdomen) or low back. °· Do not exercise if it is too hot, too humid, or if you are in a place of great height (high altitude). °· Try to avoid standing for long periods of time. Move your legs often  if you must stand in one place for a long time. °· Avoid heavy lifting. °· Wear low-heeled shoes. Sit and stand up straight. °· You can have sex unless your doctor tells you not to. °Relieving pain and discomfort °· Wear a good support bra if your breasts are sore. °· Take warm water baths (sitz baths) to soothe pain or discomfort caused by hemorrhoids. Use hemorrhoid cream if your doctor says it is okay. °· Rest with your legs raised if you have leg cramps or low back pain. °· If you have puffy, bulging veins (varicose veins) in your legs: °? Wear support hose or compression stockings as told by your doctor. °? Raise (elevate) your feet for 15 minutes, 3-4 times a day. °? Limit salt in your food. °Prenatal care °· Schedule your prenatal visits by the twelfth week of pregnancy. °· Write down your questions. Take them to your prenatal visits. °· Keep all your prenatal visits as told by your doctor. This is important. °Safety °· Wear your seat belt at all times when driving. °· Make a list of emergency phone numbers. The list should include numbers for family, friends, the hospital, and police and fire departments. °General instructions °· Ask your doctor for a referral to a local prenatal class. Begin classes no later than at the start of month 6 of your pregnancy. °· Ask for help if you need counseling or if you need help with nutrition. Your doctor can give you advice or tell you where to go for help. °· Do not use hot tubs, steam rooms, or saunas. °· Do not douche or use tampons or scented sanitary pads. °· Do not cross your legs for long periods of time. °· Avoid all herbs and alcohol. Avoid drugs that are not approved by your doctor. °· Do not use any tobacco products, including cigarettes, chewing tobacco, and electronic cigarettes. If you need help quitting, ask your doctor. You may get counseling or other support to help you quit. °· Avoid cat litter boxes and soil used by cats. These carry germs that can  cause birth defects in the baby and can cause a loss of your baby (miscarriage) or stillbirth. °· Visit your dentist. At home, brush your teeth with a soft toothbrush. Be gentle when you floss. °Contact a doctor if: °· You are dizzy. °· You have mild cramps or pressure in your lower belly. °· You have a nagging pain in your belly area. °· You continue to feel sick to your stomach, you throw up, or you have watery poop (diarrhea). °· You have a bad smelling fluid coming from your vagina. °· You have pain when you pee (urinate). °· You have increased puffiness (swelling) in your face, hands, legs, or ankles. °Get help right away if: °· You have a fever. °· You are leaking fluid from your vagina. °· You have spotting or bleeding from your vagina. °· You have very bad belly cramping or pain. °· You gain or lose weight rapidly. °· You throw up blood. It may look like coffee grounds. °· You are around people who have German measles, fifth disease,   or chickenpox. °· You have a very bad headache. °· You have shortness of breath. °· You have any kind of trauma, such as from a fall or a car accident. °Summary °· The first trimester of pregnancy is from week 1 until the end of week 13 (months 1 through 3). °· To take care of yourself and your unborn baby, you will need to eat healthy meals, take medicines only if your doctor tells you to do so, and do activities that are safe for you and your baby. °· Keep all follow-up visits as told by your doctor. This is important as your doctor will have to ensure that your baby is healthy and growing well. °This information is not intended to replace advice given to you by your health care provider. Make sure you discuss any questions you have with your health care provider. °Document Released: 03/17/2008 Document Revised: 01/20/2019 Document Reviewed: 10/07/2016 °Elsevier Patient Education © 2020 [employer] ° °

## 2019-06-28 NOTE — Progress Notes (Signed)
Bedside U/S shows single IUP with FHT of 174 BPM and CRL measures 40.74mm  GA is 11 w

## 2019-06-29 LAB — OBSTETRIC PANEL
Absolute Monocytes: 554 cells/uL (ref 200–950)
Antibody Screen: NOT DETECTED
Basophils Absolute: 45 cells/uL (ref 0–200)
Basophils Relative: 0.4 %
Eosinophils Absolute: 158 cells/uL (ref 15–500)
Eosinophils Relative: 1.4 %
HCT: 39 % (ref 35.0–45.0)
Hemoglobin: 13.3 g/dL (ref 11.7–15.5)
Hepatitis B Surface Ag: NONREACTIVE
Lymphs Abs: 2712 cells/uL (ref 850–3900)
MCH: 30.8 pg (ref 27.0–33.0)
MCHC: 34.1 g/dL (ref 32.0–36.0)
MCV: 90.3 fL (ref 80.0–100.0)
MPV: 10.8 fL (ref 7.5–12.5)
Monocytes Relative: 4.9 %
Neutro Abs: 7831 cells/uL — ABNORMAL HIGH (ref 1500–7800)
Neutrophils Relative %: 69.3 %
Platelets: 285 10*3/uL (ref 140–400)
RBC: 4.32 10*6/uL (ref 3.80–5.10)
RDW: 12.4 % (ref 11.0–15.0)
RPR Ser Ql: NONREACTIVE
Rubella: 2.76 index
Total Lymphocyte: 24 %
WBC: 11.3 10*3/uL — ABNORMAL HIGH (ref 3.8–10.8)

## 2019-06-29 LAB — HEMOGLOBIN A1C
Hgb A1c MFr Bld: 5 % of total Hgb (ref <5.7)
Mean Plasma Glucose: 97 (calc)
eAG (mmol/L): 5.4 (calc)

## 2019-06-29 LAB — COMPREHENSIVE METABOLIC PANEL
AG Ratio: 1.6 (calc) (ref 1.0–2.5)
ALT: 9 U/L (ref 6–29)
AST: 12 U/L (ref 10–30)
Albumin: 4.2 g/dL (ref 3.6–5.1)
Alkaline phosphatase (APISO): 63 U/L (ref 31–125)
BUN: 7 mg/dL (ref 7–25)
CO2: 25 mmol/L (ref 20–32)
Calcium: 9.3 mg/dL (ref 8.6–10.2)
Chloride: 104 mmol/L (ref 98–110)
Creat: 0.55 mg/dL (ref 0.50–1.10)
Globulin: 2.7 g/dL (calc) (ref 1.9–3.7)
Glucose, Bld: 72 mg/dL (ref 65–99)
Potassium: 3.9 mmol/L (ref 3.5–5.3)
Sodium: 138 mmol/L (ref 135–146)
Total Bilirubin: 0.3 mg/dL (ref 0.2–1.2)
Total Protein: 6.9 g/dL (ref 6.1–8.1)

## 2019-06-29 LAB — PROTEIN / CREATININE RATIO, URINE
Creatinine, Urine: 27 mg/dL (ref 20–275)
Protein/Creat Ratio: 148 mg/g creat (ref 21–161)
Protein/Creatinine Ratio: 0.148 mg/mg creat (ref 0.021–0.16)
Total Protein, Urine: 4 mg/dL — ABNORMAL LOW (ref 5–24)

## 2019-06-29 LAB — HIV ANTIBODY (ROUTINE TESTING W REFLEX): HIV 1&2 Ab, 4th Generation: NONREACTIVE

## 2019-06-30 LAB — URINE CYTOLOGY ANCILLARY ONLY
Chlamydia: NEGATIVE
Neisseria Gonorrhea: NEGATIVE

## 2019-06-30 LAB — CULTURE, OB URINE

## 2019-06-30 LAB — URINE CULTURE, OB REFLEX

## 2019-07-04 ENCOUNTER — Telehealth: Payer: Self-pay | Admitting: *Deleted

## 2019-07-04 NOTE — Telephone Encounter (Signed)
Returned patient call from 3:37pm. Left patient a message to call and schedule flu shot.

## 2019-07-06 ENCOUNTER — Ambulatory Visit (INDEPENDENT_AMBULATORY_CARE_PROVIDER_SITE_OTHER): Payer: BC Managed Care – PPO | Admitting: *Deleted

## 2019-07-06 ENCOUNTER — Other Ambulatory Visit: Payer: Self-pay

## 2019-07-06 DIAGNOSIS — Z23 Encounter for immunization: Secondary | ICD-10-CM

## 2019-07-06 DIAGNOSIS — Z3490 Encounter for supervision of normal pregnancy, unspecified, unspecified trimester: Secondary | ICD-10-CM

## 2019-07-06 NOTE — Progress Notes (Signed)
Pt here for Flu vaccine and Panorama blood draw,

## 2019-07-13 ENCOUNTER — Telehealth: Payer: Self-pay

## 2019-07-13 DIAGNOSIS — Z3490 Encounter for supervision of normal pregnancy, unspecified, unspecified trimester: Secondary | ICD-10-CM

## 2019-07-13 NOTE — Telephone Encounter (Signed)
Attempted to call pt with genetic testing results (low risk/female). Pt did not answer. Provided office phone number and requested a call back for results.

## 2019-08-23 ENCOUNTER — Telehealth (INDEPENDENT_AMBULATORY_CARE_PROVIDER_SITE_OTHER): Payer: BC Managed Care – PPO | Admitting: Certified Nurse Midwife

## 2019-08-23 ENCOUNTER — Encounter: Payer: Self-pay | Admitting: Certified Nurse Midwife

## 2019-08-23 DIAGNOSIS — O99210 Obesity complicating pregnancy, unspecified trimester: Secondary | ICD-10-CM

## 2019-08-23 DIAGNOSIS — Z3A18 18 weeks gestation of pregnancy: Secondary | ICD-10-CM

## 2019-08-23 DIAGNOSIS — Z3400 Encounter for supervision of normal first pregnancy, unspecified trimester: Secondary | ICD-10-CM

## 2019-08-23 NOTE — Progress Notes (Signed)
[place] VIRTUAL VIDEO VISIT ENCOUNTER NOTE  Provider location: [facility] [place] [facility] [place]   I connected with Kirkland Hun on 08/23/19 at  3:00 PM EST by MyChart Video Encounter at home and verified that I am speaking with the correct person using two identifiers.   I discussed the limitations, risks, security and privacy concerns of performing an evaluation and management service virtually and the availability of in person appointments. I also discussed with the patient that there may be a patient responsible charge related to this service. The patient expressed understanding and agreed to proceed. Subjective:  Rebekah Hicks is a 33 y.o. G1P0000 at 101w6d being seen today for ongoing prenatal care.  She is currently monitored for the following issues for this low-risk pregnancy and has MIGRAINE, MENSTRUAL; WEIGHT GAIN; Hyperlipidemia, mixed; Painful intercourse; Postcoital bleeding; Dysplasia of cervix, high grade CIN 2; Supervision of normal pregnancy; and Obesity during pregnancy, antepartum on their problem list.  Patient reports no complaints.  Contractions: Not present.  .  Movement: Absent. Denies any leaking of fluid.   The following portions of the patient's history were reviewed and updated as appropriate: allergies, current medications, past family history, past medical history, past social history, past surgical history and problem list.   Objective:  There were no vitals filed for this visit.  Fetal Status:     Movement: Absent     General:  Alert, oriented and cooperative. Patient is in no acute distress.  Respiratory: Normal respiratory effort, no problems with respiration noted  Mental Status: Normal mood and affect. Normal behavior. Normal judgment and thought content.  Rest of physical exam deferred due to type of encounter  Imaging: No results found.  Assessment and Plan:  Pregnancy: G1P0000 at [redacted]w[redacted]d 1.  Supervision of normal first pregnancy, antepartum - Patient doing well, no complaints - Has not received BP cuff, has taken occasional BP at home with home cuff but does not know if it is accurate or not- discussed with patient that we will get a BP cuff to her at her next appointment  - Routine prenatal care - Anticipatory guidance on upcoming appointments  - Educated and discussed fetal movement during pregnancy- patient reports she has not felt fetal movement, educated on fetal movement can start between 18-20 weeks and this is common, it can also be due to unknown placental location and the possibility of it being an anterior placenta - Anatomy US scheduled for tomorrow   2. Obesity during pregnancy, antepartum  Preterm labor symptoms and general obstetric precautions including but not limited to vaginal bleeding, contractions, leaking of fluid and fetal movement were reviewed in detail with the patient. I discussed the assessment and treatment plan with the patient. The patient was provided an opportunity to ask questions and all were answered. The patient agreed with the plan and demonstrated an understanding of the instructions. The patient was advised to call back or seek an in-person office evaluation/go to MAU at [facility] for any urgent or concerning symptoms. Please refer to After Visit Summary for other counseling recommendations.   I provided 10 minutes of face-to-face time during this encounter.  Return in about 4 weeks (around 09/20/2019) for ROB.  Future Appointments  Date Time Provider [place]  08/24/2019  1:45 PM [place] 2 [facility]-MFCUS MFC-US  09/23/2019  9:15 AM Nugent, Gerrie Nordmann, NP [facility]    Lajean Manes, [place] for [employer], [facility]

## 2019-08-24 ENCOUNTER — Other Ambulatory Visit (HOSPITAL_COMMUNITY): Payer: Self-pay | Admitting: *Deleted

## 2019-08-24 ENCOUNTER — Other Ambulatory Visit: Payer: Self-pay

## 2019-08-24 ENCOUNTER — Other Ambulatory Visit: Payer: Self-pay | Admitting: Certified Nurse Midwife

## 2019-08-24 ENCOUNTER — Ambulatory Visit (HOSPITAL_COMMUNITY)
Admission: RE | Admit: 2019-08-24 | Discharge: 2019-08-24 | Disposition: A | Discharge status: Home / Self Care | Payer: BC Managed Care – PPO | Source: Ambulatory Visit | Attending: Certified Nurse Midwife | Admitting: Certified Nurse Midwife

## 2019-08-24 DIAGNOSIS — Z3490 Encounter for supervision of normal pregnancy, unspecified, unspecified trimester: Secondary | ICD-10-CM

## 2019-08-24 DIAGNOSIS — Z3A19 19 weeks gestation of pregnancy: Secondary | ICD-10-CM

## 2019-08-24 DIAGNOSIS — Z362 Encounter for other antenatal screening follow-up: Secondary | ICD-10-CM

## 2019-08-24 DIAGNOSIS — O99212 Obesity complicating pregnancy, second trimester: Secondary | ICD-10-CM

## 2019-09-16 ENCOUNTER — Other Ambulatory Visit: Payer: Self-pay

## 2019-09-16 DIAGNOSIS — Z20822 Contact with and (suspected) exposure to covid-19: Secondary | ICD-10-CM

## 2019-09-18 LAB — NOVEL CORONAVIRUS, NAA: SARS-CoV-2, NAA: NOT DETECTED

## 2019-09-21 ENCOUNTER — Ambulatory Visit (HOSPITAL_COMMUNITY): Payer: BC Managed Care – PPO

## 2019-09-23 ENCOUNTER — Encounter: Payer: BC Managed Care – PPO | Admitting: Women's Health

## 2019-09-26 ENCOUNTER — Ambulatory Visit (INDEPENDENT_AMBULATORY_CARE_PROVIDER_SITE_OTHER): Payer: BC Managed Care – PPO | Admitting: Obstetrics & Gynecology

## 2019-09-26 ENCOUNTER — Other Ambulatory Visit: Payer: Self-pay

## 2019-09-26 VITALS — BP 116/78 | HR 80 | Wt 225.0 lb

## 2019-09-26 DIAGNOSIS — Z3A23 23 weeks gestation of pregnancy: Secondary | ICD-10-CM

## 2019-09-26 DIAGNOSIS — O99212 Obesity complicating pregnancy, second trimester: Secondary | ICD-10-CM

## 2019-09-26 DIAGNOSIS — Z3400 Encounter for supervision of normal first pregnancy, unspecified trimester: Secondary | ICD-10-CM

## 2019-09-26 DIAGNOSIS — O99210 Obesity complicating pregnancy, unspecified trimester: Secondary | ICD-10-CM

## 2019-09-26 NOTE — Progress Notes (Signed)
   PRENATAL VISIT NOTE  Subjective:  Rebekah Hicks is a 33 y.o. G1P0000 at [redacted]w[redacted]d being seen today for ongoing prenatal care.  She is currently monitored for the following issues for this low-risk pregnancy and has Hyperlipidemia, mixed; Painful intercourse; Postcoital bleeding; Dysplasia of cervix, high grade CIN 2; Supervision of normal pregnancy; and Obesity during pregnancy, antepartum on their problem list.  Patient reports no complaints.  Contractions: Not present. Vag. Bleeding: None.  Movement: Present. Denies leaking of fluid.   The following portions of the patient's history were reviewed and updated as appropriate: allergies, current medications, past family history, past medical history, past social history, past surgical history and problem list.   Objective:   Vitals:   09/26/19 1539  BP: 116/78  Pulse: 80  Weight: 225 lb (102.1 kg)    Fetal Status: Fetal Heart Rate (bpm): 135   Movement: Present     General:  Alert, oriented and cooperative. Patient is in no acute distress.  Skin: Skin is warm and dry. No rash noted.   Cardiovascular: Normal heart rate noted  Respiratory: Normal respiratory effort, no problems with respiration noted  Abdomen: Soft, gravid, appropriate for gestational age.  Pain/Pressure: Absent     Pelvic: Cervical exam deferred        Extremities: Normal range of motion.  Edema: None  Mental Status: Normal mood and affect. Normal behavior. Normal judgment and thought content.   Assessment and Plan:  Pregnancy: G1P0000 at [redacted]w[redacted]d 1. Supervision of normal first pregnancy, antepartum Did not have AFP drawn.  Explianed test and that she is getting detailed [place] if AFP was abnml.  Needs to increase frequency of home BP monitoring.  Glucola and 28 week labs next visit.  Has f/u US tomorrow.    Preterm labor symptoms and general obstetric precautions including but not limited to vaginal bleeding, contractions, leaking of fluid and fetal movement were  reviewed in detail with the patient. Please refer to After Visit Summary for other counseling recommendations.   Return in about 4 weeks (around 10/24/2019).  Future Appointments  Date Time Provider [place]  09/27/2019  3:30 PM [facility]-MFC [place] 1 WH-MFCUS MFC-US    Silas [place], MD

## 2019-09-27 ENCOUNTER — Ambulatory Visit (HOSPITAL_COMMUNITY)
Admission: RE | Admit: 2019-09-27 | Discharge: 2019-09-27 | Disposition: A | Discharge status: Home / Self Care | Payer: BC Managed Care – PPO | Source: Ambulatory Visit | Attending: Obstetrics | Admitting: Obstetrics

## 2019-09-27 DIAGNOSIS — Z362 Encounter for other antenatal screening follow-up: Secondary | ICD-10-CM | POA: Diagnosis present

## 2019-09-27 DIAGNOSIS — Z3A23 23 weeks gestation of pregnancy: Secondary | ICD-10-CM | POA: Diagnosis not present

## 2019-09-27 DIAGNOSIS — O99212 Obesity complicating pregnancy, second trimester: Secondary | ICD-10-CM | POA: Diagnosis not present

## 2019-10-14 NOTE — L&D Delivery Note (Signed)
Patient is a 34 y.o. now G1P1 s/p NSVD at [redacted]w[redacted]d, who was admitted for PROM @1800  on 01/13/20.  She progressed with augmentation to complete (FB and pitocin) and pushed >2 hours to deliver.  Cord clamping delayed by several minutes then clamped by CNM and cut by FOB.  Placenta intact and spontaneous, bleeding minimal.  Right labial laceration repaired without difficulty.  Mom and baby stable prior to transfer to postpartum. She plans on breastfeeding. She is unsure of method for birth control.  Delivery Note At 4:34 AM a viable and healthy female was delivered via Vaginal, Spontaneous (Presentation: Right Occiput Anterior, compound presentation).  APGAR: 8, 9; weight 7 lb 6.3 oz (3355 g).   Placenta status: Spontaneous, Intact.  Cord: 3 vessels with the following complications: None.  Cord pH: pending  Anesthesia: Epidural Episiotomy: None Lacerations: Labial Suture Repair: 4.0 vicryl Est. Blood Loss (mL): 180  Mom to postpartum.  Baby to Couplet care / Skin to Skin.  03/14/20 CNM 01/14/2020, 5:39 AM

## 2019-10-19 ENCOUNTER — Telehealth: Payer: Self-pay

## 2019-10-19 ENCOUNTER — Observation Stay (HOSPITAL_COMMUNITY): Payer: BC Managed Care – PPO

## 2019-10-19 ENCOUNTER — Other Ambulatory Visit: Payer: Self-pay

## 2019-10-19 ENCOUNTER — Observation Stay (HOSPITAL_COMMUNITY)
Admission: AD | Admit: 2019-10-19 | Discharge: 2019-10-20 | Disposition: A | Discharge status: Home / Self Care | Payer: BC Managed Care – PPO | Attending: Obstetrics & Gynecology | Admitting: Obstetrics & Gynecology

## 2019-10-19 ENCOUNTER — Encounter (HOSPITAL_COMMUNITY): Payer: Self-pay | Admitting: Obstetrics & Gynecology

## 2019-10-19 DIAGNOSIS — O9A212 Injury, poisoning and certain other consequences of external causes complicating pregnancy, second trimester: Secondary | ICD-10-CM | POA: Diagnosis present

## 2019-10-19 DIAGNOSIS — Z8249 Family history of ischemic heart disease and other diseases of the circulatory system: Secondary | ICD-10-CM | POA: Insufficient documentation

## 2019-10-19 DIAGNOSIS — Z3A27 27 weeks gestation of pregnancy: Secondary | ICD-10-CM | POA: Diagnosis not present

## 2019-10-19 DIAGNOSIS — Z833 Family history of diabetes mellitus: Secondary | ICD-10-CM | POA: Diagnosis not present

## 2019-10-19 DIAGNOSIS — M542 Cervicalgia: Secondary | ICD-10-CM

## 2019-10-19 HISTORY — DX: Injury, poisoning and certain other consequences of external causes complicating pregnancy, second trimester: O9A.212

## 2019-10-19 MED ORDER — CALCIUM CARBONATE ANTACID 500 MG PO CHEW: 2.0000 | CHEWABLE_TABLET | ORAL | Status: DC | PRN

## 2019-10-19 MED ORDER — ZOLPIDEM TARTRATE 5 MG PO TABS: 5.0000 mg | ORAL_TABLET | Freq: Every evening | ORAL | Status: DC | PRN

## 2019-10-19 MED ORDER — PRENATAL MULTIVITAMIN CH: 1.0000 | ORAL_TABLET | Freq: Every day | ORAL | Status: DC

## 2019-10-19 MED ORDER — DOCUSATE SODIUM 100 MG PO CAPS: 100.0000 mg | ORAL_CAPSULE | Freq: Every day | ORAL | Status: DC

## 2019-10-19 MED ORDER — ACETAMINOPHEN 325 MG PO TABS
650.0000 mg | ORAL_TABLET | ORAL | Status: DC | PRN
  Administered 2019-10-19: 650 mg via ORAL
  Filled 2019-10-19: qty 2
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
## 2019-10-19 NOTE — MAU Note (Signed)
CT called transport will come get patient.
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
## 2019-10-20 DIAGNOSIS — O9A212 Injury, poisoning and certain other consequences of external causes complicating pregnancy, second trimester: Secondary | ICD-10-CM | POA: Diagnosis not present

## 2019-10-20 LAB — ABO/RH: ABO/RH(D): A POS

## 2019-10-20 LAB — TYPE AND SCREEN
ABO/RH(D): A POS
Antibody Screen: NEGATIVE
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
## 2019-10-28 ENCOUNTER — Ambulatory Visit (INDEPENDENT_AMBULATORY_CARE_PROVIDER_SITE_OTHER): Payer: BC Managed Care – PPO | Admitting: Certified Nurse Midwife

## 2019-10-28 ENCOUNTER — Other Ambulatory Visit: Payer: Self-pay

## 2019-10-28 VITALS — BP 111/71 | HR 87 | Temp 97.7°F | Wt 230.0 lb

## 2019-10-28 DIAGNOSIS — Z23 Encounter for immunization: Secondary | ICD-10-CM | POA: Diagnosis not present

## 2019-10-28 DIAGNOSIS — Z3402 Encounter for supervision of normal first pregnancy, second trimester: Secondary | ICD-10-CM

## 2019-10-28 DIAGNOSIS — Z3A28 28 weeks gestation of pregnancy: Secondary | ICD-10-CM

## 2019-10-28 NOTE — Patient Instructions (Signed)
Third Trimester of Pregnancy The third trimester is from week 28 through week 40 (months 7 through 9). The third trimester is a time when the unborn baby (fetus) is growing rapidly. At the end of the ninth month, the fetus is about 20 inches in length and weighs 6-10 pounds. Body changes during your third trimester Your body will continue to go through many changes during pregnancy. The changes vary from woman to woman. During the third trimester:  Your weight will continue to increase. You can expect to gain 25-35 pounds (11-16 kg) by the end of the pregnancy.  You may begin to get stretch marks on your hips, abdomen, and breasts.  You may urinate more often because the fetus is moving lower into your pelvis and pressing on your bladder.  You may develop or continue to have heartburn. This is caused by increased hormones that slow down muscles in the digestive tract.  You may develop or continue to have constipation because increased hormones slow digestion and cause the muscles that push waste through your intestines to relax.  You may develop hemorrhoids. These are swollen veins (varicose veins) in the rectum that can itch or be painful.  You may develop swollen, bulging veins (varicose veins) in your legs.  You may have increased body aches in the pelvis, back, or thighs. This is due to weight gain and increased hormones that are relaxing your joints.  You may have changes in your hair. These can include thickening of your hair, rapid growth, and changes in texture. Some women also have hair loss during or after pregnancy, or hair that feels dry or thin. Your hair will most likely return to normal after your baby is born.  Your breasts will continue to grow and they will continue to become tender. A yellow fluid (colostrum) may leak from your breasts. This is the first milk you are producing for your baby.  Your belly button may stick out.  You may notice more swelling in your hands,  face, or ankles.  You may have increased tingling or numbness in your hands, arms, and legs. The skin on your belly may also feel numb.  You may feel short of breath because of your expanding uterus.  You may have more problems sleeping. This can be caused by the size of your belly, increased need to urinate, and an increase in your body's metabolism.  You may notice the fetus "dropping," or moving lower in your abdomen (lightening).  You may have increased vaginal discharge.  You may notice your joints feel loose and you may have pain around your pelvic bone. What to expect at prenatal visits You will have prenatal exams every 2 weeks until week 36. Then you will have weekly prenatal exams. During a routine prenatal visit:  You will be weighed to make sure you and the baby are growing normally.  Your blood pressure will be taken.  Your abdomen will be measured to track your baby's growth.  The fetal heartbeat will be listened to.  Any test results from the previous visit will be discussed.  You may have a cervical check near your due date to see if your cervix has softened or thinned (effaced).  You will be tested for Group B streptococcus. This happens between 35 and 37 weeks. Your health care provider may ask you:  What your birth plan is.  How you are feeling.  If you are feeling the baby move.  If you have had any abnormal   symptoms, such as leaking fluid, bleeding, severe headaches, or abdominal cramping.  If you are using any tobacco products, including cigarettes, chewing tobacco, and electronic cigarettes.  If you have any questions. Other tests or screenings that may be performed during your third trimester include:  Blood tests that check for low iron levels (anemia).  Fetal testing to check the health, activity level, and growth of the fetus. Testing is done if you have certain medical conditions or if there are problems during the pregnancy.  Nonstress test  (NST). This test checks the health of your baby to make sure there are no signs of problems, such as the baby not getting enough oxygen. During this test, a belt is placed around your belly. The baby is made to move, and its heart rate is monitored during movement. What is false labor? False labor is a condition in which you feel small, irregular tightenings of the muscles in the womb (contractions) that usually go away with rest, changing position, or drinking water. These are called Braxton Hicks contractions. Contractions may last for hours, days, or even weeks before true labor sets in. If contractions come at regular intervals, become more frequent, increase in intensity, or become painful, you should see your health care provider. What are the signs of labor?  Abdominal cramps.  Regular contractions that start at 10 minutes apart and become stronger and more frequent with time.  Contractions that start on the top of the uterus and spread down to the lower abdomen and back.  Increased pelvic pressure and dull back pain.  A watery or bloody mucus discharge that comes from the vagina.  Leaking of amniotic fluid. This is also known as your "water breaking." It could be a slow trickle or a gush. Let your health care provider know if it has a color or strange odor. If you have any of these signs, call your health care provider right away, even if it is before your due date. Follow these instructions at home: Medicines  Follow your health care provider's instructions regarding medicine use. Specific medicines may be either safe or unsafe to take during pregnancy.  Take a prenatal vitamin that contains at least 600 micrograms (mcg) of folic acid.  If you develop constipation, try taking a stool softener if your health care provider approves. Eating and drinking   Eat a balanced diet that includes fresh fruits and vegetables, whole grains, good sources of protein such as meat, eggs, or tofu,  and low-fat dairy. Your health care provider will help you determine the amount of weight gain that is right for you.  Avoid raw meat and uncooked cheese. These carry germs that can cause birth defects in the baby.  If you have low calcium intake from food, talk to your health care provider about whether you should take a daily calcium supplement.  Eat four or five small meals rather than three large meals a day.  Limit foods that are high in fat and processed sugars, such as fried and sweet foods.  To prevent constipation: ? Drink enough fluid to keep your urine clear or pale yellow. ? Eat foods that are high in fiber, such as fresh fruits and vegetables, whole grains, and beans. Activity  Exercise only as directed by your health care provider. Most women can continue their usual exercise routine during pregnancy. Try to exercise for 30 minutes at least 5 days a week. Stop exercising if you experience uterine contractions.  Avoid heavy lifting.  Do   not exercise in extreme heat or humidity, or at high altitudes.  Wear low-heel, comfortable shoes.  Practice good posture.  You may continue to have sex unless your health care provider tells you otherwise. Relieving pain and discomfort  Take frequent breaks and rest with your legs elevated if you have leg cramps or low back pain.  Take warm sitz baths to soothe any pain or discomfort caused by hemorrhoids. Use hemorrhoid cream if your health care provider approves.  Wear a good support bra to prevent discomfort from breast tenderness.  If you develop varicose veins: ? Wear support pantyhose or compression stockings as told by your healthcare provider. ? Elevate your feet for 15 minutes, 3-4 times a day. Prenatal care  Write down your questions. Take them to your prenatal visits.  Keep all your prenatal visits as told by your health care provider. This is important. Safety  Wear your seat belt at all times when driving.  Make  a list of emergency phone numbers, including numbers for family, friends, the hospital, and police and fire departments. General instructions  Avoid cat litter boxes and soil used by cats. These carry germs that can cause birth defects in the baby. If you have a cat, ask someone to clean the litter box for you.  Do not travel far distances unless it is absolutely necessary and only with the approval of your health care provider.  Do not use hot tubs, steam rooms, or saunas.  Do not drink alcohol.  Do not use any products that contain nicotine or tobacco, such as cigarettes and e-cigarettes. If you need help quitting, ask your health care provider.  Do not use any medicinal herbs or unprescribed drugs. These chemicals affect the formation and growth of the baby.  Do not douche or use tampons or scented sanitary pads.  Do not cross your legs for long periods of time.  To prepare for the arrival of your baby: ? Take prenatal classes to understand, practice, and ask questions about labor and delivery. ? Make a trial run to the hospital. ? Visit the hospital and tour the maternity area. ? Arrange for maternity or paternity leave through employers. ? Arrange for family and friends to take care of pets while you are in the hospital. ? Purchase a rear-facing car seat and make sure you know how to install it in your car. ? Pack your hospital bag. ? Prepare the baby's nursery. Make sure to remove all pillows and stuffed animals from the baby's crib to prevent suffocation.  Visit your dentist if you have not gone during your pregnancy. Use a soft toothbrush to brush your teeth and be gentle when you floss. Contact a health care provider if:  You are unsure if you are in labor or if your water has broken.  You become dizzy.  You have mild pelvic cramps, pelvic pressure, or nagging pain in your abdominal area.  You have lower back pain.  You have persistent nausea, vomiting, or  diarrhea.  You have an unusual or bad smelling vaginal discharge.  You have pain when you urinate. Get help right away if:  Your water breaks before 37 weeks.  You have regular contractions less than 5 minutes apart before 37 weeks.  You have a fever.  You are leaking fluid from your vagina.  You have spotting or bleeding from your vagina.  You have severe abdominal pain or cramping.  You have rapid weight loss or weight gain.  You have   shortness of breath with chest pain.  You notice sudden or extreme swelling of your face, hands, ankles, feet, or legs.  Your baby makes fewer than 10 movements in 2 hours.  You have severe headaches that do not go away when you take medicine.  You have vision changes. Summary  The third trimester is from week 28 through week 40, months 7 through 9. The third trimester is a time when the unborn baby (fetus) is growing rapidly.  During the third trimester, your discomfort may increase as you and your baby continue to gain weight. You may have abdominal, leg, and back pain, sleeping problems, and an increased need to urinate.  During the third trimester your breasts will keep growing and they will continue to become tender. A yellow fluid (colostrum) may leak from your breasts. This is the first milk you are producing for your baby.  False labor is a condition in which you feel small, irregular tightenings of the muscles in the womb (contractions) that eventually go away. These are called Braxton Hicks contractions. Contractions may last for hours, days, or even weeks before true labor sets in.  Signs of labor can include: abdominal cramps; regular contractions that start at 10 minutes apart and become stronger and more frequent with time; watery or bloody mucus discharge that comes from the vagina; increased pelvic pressure and dull back pain; and leaking of amniotic fluid. This information is not intended to replace advice given to you by your  health care provider. Make sure you discuss any questions you have with your health care provider. Document Revised: 01/20/2019 Document Reviewed: 11/04/2016 Elsevier Patient Education  2020 [employer]  
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
## 2019-10-31 LAB — 2HR GTT W 1 HR, CARPENTER, 75 G
Glucose, 1 Hr, Gest: 117 mg/dL (ref 65–179)
Glucose, 2 Hr, Gest: 93 mg/dL (ref 65–152)
Glucose, Fasting, Gest: 73 mg/dL (ref 65–91)

## 2019-10-31 LAB — RPR: RPR Ser Ql: NONREACTIVE

## 2019-10-31 LAB — HIV ANTIBODY (ROUTINE TESTING W REFLEX): HIV 1&2 Ab, 4th Generation: NONREACTIVE

## 2019-10-31 LAB — CBC
HCT: 34.7 % — ABNORMAL LOW (ref 35.0–45.0)
Hemoglobin: 11.9 g/dL (ref 11.7–15.5)
MCH: 31.2 pg (ref 27.0–33.0)
MCHC: 34.3 g/dL (ref 32.0–36.0)
MCV: 90.8 fL (ref 80.0–100.0)
MPV: 10.9 fL (ref 7.5–12.5)
Platelets: 232 10*3/uL (ref 140–400)
RBC: 3.82 10*6/uL (ref 3.80–5.10)
RDW: 12 % (ref 11.0–15.0)
WBC: 10.6 10*3/uL (ref 3.8–10.8)

## 2019-11-24 ENCOUNTER — Encounter: Payer: Self-pay | Admitting: *Deleted

## 2019-11-25 ENCOUNTER — Telehealth (INDEPENDENT_AMBULATORY_CARE_PROVIDER_SITE_OTHER): Payer: BC Managed Care – PPO | Admitting: Obstetrics and Gynecology

## 2019-11-25 DIAGNOSIS — Z3400 Encounter for supervision of normal first pregnancy, unspecified trimester: Secondary | ICD-10-CM

## 2019-11-25 DIAGNOSIS — Z3A32 32 weeks gestation of pregnancy: Secondary | ICD-10-CM

## 2019-11-25 NOTE — Patient Instructions (Signed)
Contraception Choices Contraception, also called birth control, refers to methods or devices that prevent pregnancy. Hormonal methods Contraceptive implant  A contraceptive implant is a thin, plastic tube that contains a hormone. It is inserted into the upper part of the arm. It can remain in place for up to 3 years. Progestin-only injections Progestin-only injections are injections of progestin, a synthetic form of the hormone progesterone. They are given every 3 months by a health care provider. Birth control pills  Birth control pills are pills that contain hormones that prevent pregnancy. They must be taken once a day, preferably at the same time each day. Birth control patch  The birth control patch contains hormones that prevent pregnancy. It is placed on the skin and must be changed once a week for three weeks and removed on the fourth week. A prescription is needed to use this method of contraception. Vaginal ring  A vaginal ring contains hormones that prevent pregnancy. It is placed in the vagina for three weeks and removed on the fourth week. After that, the process is repeated with a new ring. A prescription is needed to use this method of contraception. Emergency contraceptive Emergency contraceptives prevent pregnancy after unprotected sex. They come in pill form and can be taken up to 5 days after sex. They work best the sooner they are taken after having sex. Most emergency contraceptives are available without a prescription. This method should not be used as your only form of birth control. Barrier methods Female condom  A female condom is a thin sheath that is worn over the penis during sex. Condoms keep sperm from going inside a woman's body. They can be used with a spermicide to increase their effectiveness. They should be disposed after a single use. Female condom  A female condom is a soft, loose-fitting sheath that is put into the vagina before sex. The condom keeps sperm  from going inside a woman's body. They should be disposed after a single use. Diaphragm  A diaphragm is a soft, dome-shaped barrier. It is inserted into the vagina before sex, along with a spermicide. The diaphragm blocks sperm from entering the uterus, and the spermicide kills sperm. A diaphragm should be left in the vagina for 6-8 hours after sex and removed within 24 hours. A diaphragm is prescribed and fitted by a health care provider. A diaphragm should be replaced every 1-2 years, after giving birth, after gaining more than 15 lb (6.8 kg), and after pelvic surgery. Cervical cap  A cervical cap is a round, soft latex or plastic cup that fits over the cervix. It is inserted into the vagina before sex, along with spermicide. It blocks sperm from entering the uterus. The cap should be left in place for 6-8 hours after sex and removed within 48 hours. A cervical cap must be prescribed and fitted by a health care provider. It should be replaced every 2 years. Sponge  A sponge is a soft, circular piece of polyurethane foam with spermicide on it. The sponge helps block sperm from entering the uterus, and the spermicide kills sperm. To use it, you make it wet and then insert it into the vagina. It should be inserted before sex, left in for at least 6 hours after sex, and removed and thrown away within 30 hours. Spermicides Spermicides are chemicals that kill or block sperm from entering the cervix and uterus. They can come as a cream, jelly, suppository, foam, or tablet. A spermicide should be inserted into the   vagina with an applicator at least 10-15 minutes before sex to allow time for it to work. The process must be repeated every time you have sex. Spermicides do not require a prescription. Intrauterine contraception Intrauterine device (IUD) An IUD is a T-shaped device that is put in a woman's uterus. There are two types:  Hormone IUD.This type contains progestin, a synthetic form of the hormone  progesterone. This type can stay in place for 3-5 years.  Copper IUD.This type is wrapped in copper wire. It can stay in place for 10 years.  Permanent methods of contraception Female tubal ligation In this method, a woman's fallopian tubes are sealed, tied, or blocked during surgery to prevent eggs from traveling to the uterus. Hysteroscopic sterilization In this method, a small, flexible insert is placed into each fallopian tube. The inserts cause scar tissue to form in the fallopian tubes and block them, so sperm cannot reach an egg. The procedure takes about 3 months to be effective. Another form of birth control must be used during those 3 months. Female sterilization This is a procedure to tie off the tubes that carry sperm (vasectomy). After the procedure, the man can still ejaculate fluid (semen). Natural planning methods Natural family planning In this method, a couple does not have sex on days when the woman could become pregnant. Calendar method This means keeping track of the length of each menstrual cycle, identifying the days when pregnancy can happen, and not having sex on those days. Ovulation method In this method, a couple avoids sex during ovulation. Symptothermal method This method involves not having sex during ovulation. The woman typically checks for ovulation by watching changes in her temperature and in the consistency of cervical mucus. Post-ovulation method In this method, a couple waits to have sex until after ovulation. Summary  Contraception, also called birth control, means methods or devices that prevent pregnancy.  Hormonal methods of contraception include implants, injections, pills, patches, vaginal rings, and emergency contraceptives.  Barrier methods of contraception can include female condoms, female condoms, diaphragms, cervical caps, sponges, and spermicides.  There are two types of IUDs (intrauterine devices). An IUD can be put in a woman's uterus to  prevent pregnancy for 3-5 years.  Permanent sterilization can be done through a procedure for males, females, or both.  Natural family planning methods involve not having sex on days when the woman could become pregnant. This information is not intended to replace advice given to you by your health care provider. Make sure you discuss any questions you have with your health care provider. Document Revised: 10/01/2017 Document Reviewed: 11/01/2016 Elsevier Patient Education  2020 [employer]  

## 2019-11-25 NOTE — Progress Notes (Signed)
   TELEHEALTH OBSTETRICS PRENATAL VIRTUAL VIDEO VISIT ENCOUNTER NOTE  Provider location: Center for [employer] at [place]   I connected with Rebekah Hicks on 11/25/19 at  9:00 AM EST by WebEx Video Encounter at home and verified that I am speaking with the correct person using two identifiers.   I discussed the limitations, risks, security and privacy concerns of performing an evaluation and management service virtually and the availability of in person appointments. I also discussed with the patient that there may be a patient responsible charge related to this service. The patient expressed understanding and agreed to proceed. Subjective:  Rebekah Hicks is a 34 y.o. G1P0000 at [redacted]w[redacted]d being seen today for ongoing prenatal care.  She is currently monitored for the following issues for this low-risk pregnancy and has Hyperlipidemia, mixed; Painful intercourse; Postcoital bleeding; Dysplasia of cervix, high grade CIN 2; Supervision of normal pregnancy; Obesity during pregnancy, antepartum; and Traumatic injury during pregnancy in second trimester on their problem list.  Patient reports no complaints.  Contractions: Not present. Vag. Bleeding: None.  Movement: Present. Denies any leaking of fluid.   The following portions of the patient's history were reviewed and updated as appropriate: allergies, current medications, past family history, past medical history, past social history, past surgical history and problem list.   Objective:   Vitals:   11/25/19 0843  BP: 116/74  Pulse: 88  Weight: 238 lb (108 kg)    Fetal Status:     Movement: Present     General:  Alert, oriented and cooperative. Patient is in no acute distress.  Respiratory: Normal respiratory effort, no problems with respiration noted  Mental Status: Normal mood and affect. Normal behavior. Normal judgment and thought content.  Rest of physical exam deferred due to type of encounter  Imaging: No  results found.  Assessment and Plan:  Pregnancy: G1P0000 at [redacted]w[redacted]d 1. Supervision of normal first pregnancy, antepartum  Discussed on-line tour of the hospital and classes for new moms GBS in person visit @ 36 weeks. Ok to continue virtual visit Discussed circumcision. Call insurance to check out of pocket costs.  Discussed birth control options for breast feeding moms.    Preterm labor symptoms and general obstetric precautions including but not limited to vaginal bleeding, contractions, leaking of fluid and fetal movement were reviewed in detail with the patient. I discussed the assessment and treatment plan with the patient. The patient was provided an opportunity to ask questions and all were answered. The patient agreed with the plan and demonstrated an understanding of the instructions. The patient was advised to call back or seek an in-person office evaluation/go to MAU at [facility] for any urgent or concerning symptoms. Please refer to After Visit Summary for other counseling recommendations.   I provided 10 minutes of face-to-face time during this encounter.  No follow-ups on file.  Future Appointments  Date Time Provider Department Center  12/26/2019  3:00 PM Lesly Dukes, MD [facility]    Venia Carbon, NP Center for [facility], [facility]

## 2019-12-09 ENCOUNTER — Telehealth (INDEPENDENT_AMBULATORY_CARE_PROVIDER_SITE_OTHER): Payer: BC Managed Care – PPO | Admitting: Certified Nurse Midwife

## 2019-12-09 DIAGNOSIS — Z3400 Encounter for supervision of normal first pregnancy, unspecified trimester: Secondary | ICD-10-CM

## 2019-12-09 DIAGNOSIS — Z3A34 34 weeks gestation of pregnancy: Secondary | ICD-10-CM

## 2019-12-09 DIAGNOSIS — Z3403 Encounter for supervision of normal first pregnancy, third trimester: Secondary | ICD-10-CM

## 2019-12-09 NOTE — Patient Instructions (Signed)
[facility] @ [facility] [address] [place], [place] [phone]  [facility] @ [facility] [address] [place], [place] [phone]  [facility] [address] [place], [place] [phone]  [facility] @ [facility] [address] [place], [place] [phone]  [facility] @ [place] [address] [place], [place] [phone]  [facility] (Pediatrics) [address] [place], [place] [phone]

## 2019-12-09 NOTE — Progress Notes (Signed)
   TELEHEALTH OBSTETRICS PRENATAL VIRTUAL VISIT ENCOUNTER NOTE  Provider location: Center for [facility] at [place]   I connected with Rebekah Hicks on 12/09/19 at  34:35 AM EST by Phone Encounter at home and verified that I am speaking with the correct person using two identifiers.   I discussed the limitations, risks, security and privacy concerns of performing an evaluation and management service virtually and the availability of in person appointments. I also discussed with the patient that there may be a patient responsible charge related to this service. The patient expressed understanding and agreed to proceed. Subjective:  Rebekah Hicks is a 34 y.o. G1P0000 at [redacted]w[redacted]d being seen today for ongoing prenatal care.  She is currently monitored for the following issues for this low-risk pregnancy and has Hyperlipidemia, mixed; Painful intercourse; Postcoital bleeding; Dysplasia of cervix, high grade CIN 2; Supervision of normal pregnancy; Obesity during pregnancy, antepartum; and Traumatic injury during pregnancy in second trimester on their problem list.  Patient reports no complaints.  Contractions: Not present.  .  Movement: Present. Denies any leaking of fluid.   The following portions of the patient's history were reviewed and updated as appropriate: allergies, current medications, past family history, past medical history, past social history, past surgical history and problem list.   Objective:   Vitals:   12/09/19 0949  BP: 121/67  Weight: 240 lb (108.9 kg)    Fetal Status:     Movement: Present     General:  Alert, oriented and cooperative. Patient is in no acute distress.  Respiratory: Normal respiratory effort, no problems with respiration noted  Mental Status: Normal mood and affect. Normal behavior. Normal judgment and thought content.  Rest of physical exam deferred due to type of encounter  Imaging: No results found.  Assessment and Plan:    Pregnancy: G1P0000 at [redacted]w[redacted]d 1. Supervision of normal first pregnancy, antepartum  Preterm labor symptoms and general obstetric precautions including but not limited to vaginal bleeding, contractions, leaking of fluid and fetal movement were reviewed in detail with the patient. I discussed the assessment and treatment plan with the patient. The patient was provided an opportunity to ask questions and all were answered. The patient agreed with the plan and demonstrated an understanding of the instructions. The patient was advised to call back or seek an in-person office evaluation/go to MAU at [facility] for any urgent or concerning symptoms. Please refer to After Visit Summary for other counseling recommendations.   I provided 7 minutes of face-to-face time during this encounter.  No follow-ups on file.  Future Appointments  Date Time Provider Department Center  12/26/2019  3:00 PM Lesly Dukes, MD [facility]    Donette Larry, CNM Center for [employer], [facility]

## 2019-12-20 ENCOUNTER — Other Ambulatory Visit: Payer: Self-pay

## 2019-12-20 ENCOUNTER — Ambulatory Visit (INDEPENDENT_AMBULATORY_CARE_PROVIDER_SITE_OTHER): Payer: BC Managed Care – PPO | Admitting: Advanced Practice Midwife

## 2019-12-20 VITALS — BP 102/72 | HR 84 | Wt 249.0 lb

## 2019-12-20 DIAGNOSIS — Z113 Encounter for screening for infections with a predominantly sexual mode of transmission: Secondary | ICD-10-CM | POA: Diagnosis not present

## 2019-12-20 DIAGNOSIS — Z3403 Encounter for supervision of normal first pregnancy, third trimester: Secondary | ICD-10-CM

## 2019-12-20 DIAGNOSIS — Z3A35 35 weeks gestation of pregnancy: Secondary | ICD-10-CM

## 2019-12-20 NOTE — Progress Notes (Signed)
   PRENATAL VISIT NOTE  Subjective:  Rebekah Hicks is a 34 y.o. G1P0000 at [redacted]w[redacted]d being seen today for ongoing prenatal care.  She is currently monitored for the following issues for this low-risk pregnancy and has Hyperlipidemia, mixed; Painful intercourse; Postcoital bleeding; Dysplasia of cervix, high grade CIN 2; Supervision of normal pregnancy; Obesity during pregnancy, antepartum; and Traumatic injury during pregnancy in second trimester on their problem list.  Patient reports no complaints.  Contractions: Not present. Vag. Bleeding: None.  Movement: Present. Denies leaking of fluid.   The following portions of the patient's history were reviewed and updated as appropriate: allergies, current medications, past family history, past medical history, past social history, past surgical history and problem list.   Objective:   Vitals:   12/20/19 1539  BP: 102/72  Pulse: 84  Weight: 249 lb (112.9 kg)    Fetal Status: Fetal Heart Rate (bpm): 149   Movement: Present     General:  Alert, oriented and cooperative. Patient is in no acute distress.  Skin: Skin is warm and dry. No rash noted.   Cardiovascular: Normal heart rate noted  Respiratory: Normal respiratory effort, no problems with respiration noted  Abdomen: Soft, gravid, appropriate for gestational age.  Pain/Pressure: Absent     Pelvic: Cervical exam deferred        Extremities: Normal range of motion.  Edema: None  Mental Status: Normal mood and affect. Normal behavior. Normal judgment and thought content.   Assessment and Plan:  Pregnancy: G1P0000 at [redacted]w[redacted]d 1. Encounter for supervision of normal first pregnancy in third trimester --Anticipatory guidance about next visits/weeks of pregnancy given. --This visit scheduled due to recent cramping/contractions which have resolved.  GBS/GC collected today so pt does not have to come back next week.  Will reswab if pt goes to 41 weeks.  --Next appt at 38 weeks, virtual  visit  - Culture, beta strep (group b only) - Cervicovaginal ancillary only( [place])  Preterm labor symptoms and general obstetric precautions including but not limited to vaginal bleeding, contractions, leaking of fluid and fetal movement were reviewed in detail with the patient. Please refer to After Visit Summary for other counseling recommendations.   No follow-ups on file.  Future Appointments  Date Time Provider Department Center  01/02/2020  3:05 PM Lesly Dukes, MD [facility]    Sharen Counter, CNM

## 2019-12-21 LAB — CERVICOVAGINAL ANCILLARY ONLY
Chlamydia: NEGATIVE
Comment: NEGATIVE
Comment: NORMAL
Neisseria Gonorrhea: NEGATIVE

## 2019-12-23 LAB — OB RESULTS CONSOLE GBS: GBS: NEGATIVE

## 2019-12-23 LAB — CULTURE, BETA STREP (GROUP B ONLY)
MICRO NUMBER:: 10231215
SPECIMEN QUALITY:: ADEQUATE

## 2019-12-26 ENCOUNTER — Encounter: Payer: BC Managed Care – PPO | Admitting: Obstetrics & Gynecology

## 2020-01-02 ENCOUNTER — Other Ambulatory Visit: Payer: Self-pay

## 2020-01-02 ENCOUNTER — Telehealth (INDEPENDENT_AMBULATORY_CARE_PROVIDER_SITE_OTHER): Payer: BC Managed Care – PPO | Admitting: Obstetrics & Gynecology

## 2020-01-02 ENCOUNTER — Encounter: Payer: Self-pay | Admitting: Obstetrics & Gynecology

## 2020-01-02 DIAGNOSIS — Z3400 Encounter for supervision of normal first pregnancy, unspecified trimester: Secondary | ICD-10-CM

## 2020-01-02 NOTE — Progress Notes (Signed)
   TELEHEALTH VIRTUAL OBSTETRICS VISIT ENCOUNTER NOTE  I connected with Rebekah Hicks on 01/02/20 at  3:05 PM EDT by telephone at home and verified that I am speaking with the correct person using two identifiers.   I discussed the limitations, risks, security and privacy concerns of performing an evaluation and management service by telephone and the availability of in person appointments. I also discussed with the patient that there may be a patient responsible charge related to this service. The patient expressed understanding and agreed to proceed.  Subjective:  Rebekah Hicks is a 34 y.o. G1P0000 at [redacted]w[redacted]d being followed for ongoing prenatal care.  She is currently monitored for the following issues for this low-risk pregnancy and has Hyperlipidemia, mixed; Painful intercourse; Postcoital bleeding; Dysplasia of cervix, high grade CIN 2; Supervision of normal pregnancy; Obesity during pregnancy, antepartum; and Traumatic injury during pregnancy in second trimester on their problem list.  Patient reports no complaints. Reports fetal movement. Denies any contractions, bleeding or leaking of fluid.   The following portions of the patient's history were reviewed and updated as appropriate: allergies, current medications, past family history, past medical history, past social history, past surgical history and problem list.   Objective:   General:  Alert, oriented and cooperative.   Mental Status: Normal mood and affect perceived. Normal judgment and thought content.  Rest of physical exam deferred due to type of encounter  Assessment and Plan:  Pregnancy: G1P0000 at [redacted]w[redacted]d 1. Supervision of normal first pregnancy, antepartum GBS negative BP all normal at home; encouraged to put into babyscripts weekly.   Needs to pick out pediatrician  Term labor symptoms and general obstetric precautions including but not limited to vaginal bleeding, contractions, leaking of fluid and fetal  movement were reviewed in detail with the patient.  I discussed the assessment and treatment plan with the patient. The patient was provided an opportunity to ask questions and all were answered. The patient agreed with the plan and demonstrated an understanding of the instructions. The patient was advised to call back or seek an in-person office evaluation/go to MAU at [facility] for any urgent or concerning symptoms. Please refer to After Visit Summary for other counseling recommendations.   RTC 1 week (around 39 weeks)  Elsie Lincoln, MD Center for [facility], [facility]

## 2020-01-10 ENCOUNTER — Other Ambulatory Visit: Payer: Self-pay

## 2020-01-10 ENCOUNTER — Encounter: Payer: Self-pay | Admitting: Certified Nurse Midwife

## 2020-01-10 ENCOUNTER — Ambulatory Visit (INDEPENDENT_AMBULATORY_CARE_PROVIDER_SITE_OTHER): Payer: BC Managed Care – PPO | Admitting: Certified Nurse Midwife

## 2020-01-10 VITALS — BP 114/80 | HR 94 | Wt 252.0 lb

## 2020-01-10 DIAGNOSIS — Z3400 Encounter for supervision of normal first pregnancy, unspecified trimester: Secondary | ICD-10-CM

## 2020-01-10 DIAGNOSIS — O99210 Obesity complicating pregnancy, unspecified trimester: Secondary | ICD-10-CM

## 2020-01-10 NOTE — Progress Notes (Signed)
   PRENATAL VISIT NOTE  Subjective:  Rebekah Hicks is a 34 y.o. G1P0000 at [redacted]w[redacted]d being seen today for ongoing prenatal care.  She is currently monitored for the following issues for this low-risk pregnancy and has Hyperlipidemia, mixed; Painful intercourse; Postcoital bleeding; Dysplasia of cervix, high grade CIN 2; Supervision of normal pregnancy; Obesity during pregnancy, antepartum; and Traumatic injury during pregnancy in second trimester on their problem list.  Patient reports pressure and constant cramp last night.  Contractions: Irritability. Vag. Bleeding: None.  Movement: Present. Denies leaking of fluid.   The following portions of the patient's history were reviewed and updated as appropriate: allergies, current medications, past family history, past medical history, past social history, past surgical history and problem list.   Objective:   Vitals:   01/10/20 1332  BP: 114/80  Pulse: 94  Weight: 252 lb (114.3 kg)    Fetal Status: Fetal Heart Rate (bpm): 135 Fundal Height: 39 cm Movement: Present  Presentation: Vertex  General:  Alert, oriented and cooperative. Patient is in no acute distress.  Skin: Skin is warm and dry. No rash noted.   Cardiovascular: Normal heart rate noted  Respiratory: Normal respiratory effort, no problems with respiration noted  Abdomen: Soft, gravid, appropriate for gestational age.  Pain/Pressure: Present     Pelvic: Cervical exam performed in the presence of a chaperone Dilation: Fingertip Effacement (%): 70 Station: -3  Extremities: Normal range of motion.  Edema: Trace  Mental Status: Normal mood and affect. Normal behavior. Normal judgment and thought content.   Assessment and Plan:  Pregnancy: G1P0000 at [redacted]w[redacted]d 1. Supervision of normal first pregnancy, antepartum - Routine prenatal care - Anticipatory guidance on upcoming appointments with antenatal screening starting next week after postdates  - Educated and discussed, ROB/NST on  Tuesday then ROB/AFI on Friday  - Discussed with patient IOL at 41 weeks which would be 4/14- orders for IOL placed  - Educated and discussed use of EPO, RRT, and IC to induce contractions at home  - labor precautions and reasons to present to MAU discussed   2. Obesity during pregnancy, antepartum   Term labor symptoms and general obstetric precautions including but not limited to vaginal bleeding, contractions, leaking of fluid and fetal movement were reviewed in detail with the patient. Please refer to After Visit Summary for other counseling recommendations.   Return in about 1 week (around 01/17/2020) for ROB, NST.  Future Appointments  Date Time Provider Department Center  01/17/2020  8:55 AM Kendell Bane [facility]  01/20/2020  8:45 AM [facility] NURSE [facility]    Sharyon Cable, CNM

## 2020-01-10 NOTE — Patient Instructions (Addendum)
Reasons to go to MAU:  1.  Contractions are  5 minutes apart or less, each last 1 minute, these have been going on for 2 hours, and you cannot walk or talk during them 2.  You have a large gush of fluid, or a trickle of fluid that will not stop and you have to wear a pad 3.  You have bleeding that is bright red, heavier than spotting--like menstrual bleeding (spotting can be normal in early labor or after a check of your cervix) 4.  You do not feel the baby moving like he/she normally does   Cervical Ripening: May try one or both  Red Raspberry Leaf capsules:  two 300mg  or 400mg  tablets with each meal, 2-3 times a day  Potential Side Effects Of Raspberry Leaf:  Most women do not experience any side effects from drinking raspberry leaf tea. However, nausea and loose stools are possible   Evening Primrose Oil capsules: may take 1 to 3 capsules daily. May also prick one to release the oil and insert it into your vagina at night.  Some of the potential side effects:  Upset stomach  Loose stools or diarrhea  Headaches  Nausea:

## 2020-01-10 NOTE — Progress Notes (Signed)
  Induction Assessment Scheduling Form: Fax to [facility]:  [phone]  Rebekah Hicks                                                                                   DOB:  08/24/1986                                                            MRN:  [id number]                                                                     Phone #: [phone]                        Provider:  Ronnette Juniper  (Faculty Practice)  GP:  G1P0000                                                            Estimated Date of Delivery: 01/18/20  Dating Criteria: LMP    Medical Indications for induction:  Post dates pregnancy  Admission Date/Time:  4/14 @ [facility] Gestational age on admission:  [redacted]w[redacted]d   Filed Weights   01/10/20 1332  Weight: 252 lb (114.3 kg)   HIV:  NON-REACTIVE (01/15 0857) GBS:  NEGATIVE  Dilation: Fingertip Effacement (%): 70 Station: -3 Presentation: Vertex  Method of induction(proposed):  cytotec   Scheduling Provider Signature:  Sharyon Cable, CNM                                            Today's Date:  01/10/2020

## 2020-01-11 ENCOUNTER — Telehealth (HOSPITAL_COMMUNITY): Payer: Self-pay | Admitting: *Deleted

## 2020-01-11 NOTE — Telephone Encounter (Signed)
Preadmission screen  

## 2020-01-12 ENCOUNTER — Telehealth (HOSPITAL_COMMUNITY): Payer: Self-pay | Admitting: *Deleted

## 2020-01-12 ENCOUNTER — Other Ambulatory Visit: Payer: Self-pay

## 2020-01-12 ENCOUNTER — Inpatient Hospital Stay (HOSPITAL_COMMUNITY)
Admission: AD | Admit: 2020-01-12 | Discharge: 2020-01-16 | DRG: 807 | Disposition: A | Discharge status: Home / Self Care | Payer: BC Managed Care – PPO | Attending: Obstetrics & Gynecology | Admitting: Obstetrics & Gynecology

## 2020-01-12 ENCOUNTER — Encounter (HOSPITAL_COMMUNITY): Payer: Self-pay | Admitting: Obstetrics & Gynecology

## 2020-01-12 DIAGNOSIS — O4292 Full-term premature rupture of membranes, unspecified as to length of time between rupture and onset of labor: Secondary | ICD-10-CM | POA: Diagnosis present

## 2020-01-12 DIAGNOSIS — Z20822 Contact with and (suspected) exposure to covid-19: Secondary | ICD-10-CM | POA: Diagnosis present

## 2020-01-12 DIAGNOSIS — O99210 Obesity complicating pregnancy, unspecified trimester: Secondary | ICD-10-CM | POA: Diagnosis present

## 2020-01-12 DIAGNOSIS — O480 Post-term pregnancy: Secondary | ICD-10-CM | POA: Diagnosis present

## 2020-01-12 DIAGNOSIS — Z3A39 39 weeks gestation of pregnancy: Secondary | ICD-10-CM | POA: Diagnosis not present

## 2020-01-12 DIAGNOSIS — Z3490 Encounter for supervision of normal pregnancy, unspecified, unspecified trimester: Secondary | ICD-10-CM

## 2020-01-12 DIAGNOSIS — O99214 Obesity complicating childbirth: Secondary | ICD-10-CM | POA: Diagnosis present

## 2020-01-12 DIAGNOSIS — O326XX0 Maternal care for compound presentation, not applicable or unspecified: Secondary | ICD-10-CM | POA: Diagnosis present

## 2020-01-12 DIAGNOSIS — O42913 Preterm premature rupture of membranes, unspecified as to length of time between rupture and onset of labor, third trimester: Secondary | ICD-10-CM | POA: Diagnosis not present

## 2020-01-12 MED ORDER — LACTATED RINGERS IV SOLN: INTRAVENOUS | Status: DC

## 2020-01-12 MED ORDER — ACETAMINOPHEN 325 MG PO TABS
650.0000 mg | ORAL_TABLET | ORAL | Status: DC | PRN
  Administered 2020-01-13 (×2): 650 mg via ORAL
  Filled 2020-01-12 (×2): qty 2

## 2020-01-12 MED ORDER — LACTATED RINGERS IV SOLN: 500.0000 mL | INTRAVENOUS | Status: DC | PRN

## 2020-01-12 MED ORDER — OXYTOCIN BOLUS FROM INFUSION
500.0000 mL | Freq: Once | INTRAVENOUS | Status: AC
  Administered 2020-01-14: 500 mL via INTRAVENOUS

## 2020-01-12 MED ORDER — OXYTOCIN 40 UNITS IN NORMAL SALINE INFUSION - SIMPLE MED
2.5000 [IU]/h | INTRAVENOUS | Status: DC
  Administered 2020-01-14: 2.5 [IU]/h via INTRAVENOUS
  Filled 2020-01-12: qty 1000

## 2020-01-12 MED ORDER — LIDOCAINE HCL (PF) 1 % IJ SOLN: 30.0000 mL | INTRAMUSCULAR | Status: DC | PRN

## 2020-01-12 MED ORDER — MISOPROSTOL 25 MCG QUARTER TABLET
25.0000 ug | ORAL_TABLET | ORAL | Status: DC | PRN
  Filled 2020-01-12: qty 1

## 2020-01-12 MED ORDER — ONDANSETRON HCL 4 MG/2ML IJ SOLN: 4.0000 mg | Freq: Four times a day (QID) | INTRAMUSCULAR | Status: DC | PRN

## 2020-01-12 MED ORDER — SOD CITRATE-CITRIC ACID 500-334 MG/5ML PO SOLN: 30.0000 mL | ORAL | Status: DC | PRN

## 2020-01-12 MED ORDER — TERBUTALINE SULFATE 1 MG/ML IJ SOLN: 0.2500 mg | Freq: Once | INTRAMUSCULAR | Status: DC | PRN

## 2020-01-12 NOTE — MAU Note (Signed)
Covid swab obtained without difficulty and pt tol well. No symptoms 

## 2020-01-12 NOTE — Telephone Encounter (Signed)
Preadmission screen  

## 2020-01-12 NOTE — MAU Note (Signed)
Think I lost my mucous plug earlier today. About 1800 had small gush of fld. About 2100 as I stood up felt more fld come out that soaked pants. Continue to leak small amts of clear fld. Wiped last time went to [facility] and was "yellow, green, chunky" d/c on panties. No pain

## 2020-01-12 NOTE — H&P (Signed)
Rebekah Hicks is a 34 y.o. female presenting for leaking light green fluid since 1800hrs.  Has some mild contractions.  Pregnancy has been followed at [facility] office and remarkable for : Patient Active Problem List   Diagnosis Date Noted  . Post-dates pregnancy 01/12/2020  . Traumatic injury during pregnancy in second trimester 10/19/2019  . Supervision of normal pregnancy 06/28/2019  . Obesity during pregnancy, antepartum 06/28/2019  . 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 Painful intercourse 02/22/2014  . Postcoital bleeding 02/22/2014  . Hyperlipidemia, mixed 05/23/2013   .RN Note: Think I lost my mucous plug earlier today. About 1800 had small gush of fld. About 2100 as I stood up felt more fld come out that soaked pants. Continue to leak small amts of clear fld. Wiped last time went to [facility] and was "yellow, green, chunky" d/c on panties. No pain  OB History    Gravida  1   Para  0   Term  0   Preterm  0   AB  0   Living  0     SAB  0   TAB  0   Ectopic  0   Multiple  0   Live Births             Past Medical History:  Diagnosis Date  . Migraine   . Vaginal Pap smear, abnormal    Past Surgical History:  Procedure Laterality Date  . NO PAST SURGERIES     Family History: family history includes Alzheimer's disease in her maternal grandfather; Diabetes in her father, maternal grandmother, and another family member; Heart attack in her father and paternal grandmother; Hyperlipidemia in her father and mother; Hypertension in her father and mother; Stroke in her paternal grandmother. Social History:  reports that she has never smoked. She has never used smokeless tobacco. She reports previous alcohol use of about 1.0 standard drinks of alcohol per week. She reports that she does not use drugs.     Maternal Diabetes: No Genetic Screening: Normal Maternal Ultrasounds/Referrals: Normal Fetal Ultrasounds or other Referrals:  None Maternal Substance  Abuse:  No Significant Maternal Medications:  None Significant Maternal Lab Results:  Group B Strep negative Other Comments:  None  Review of Systems  Constitutional: Negative for chills and fever.  Respiratory: Negative for shortness of breath.   Gastrointestinal: Negative for abdominal pain, constipation, diarrhea and nausea.  Genitourinary: Positive for vaginal discharge. Negative for dysuria, pelvic pain and vaginal bleeding.  Musculoskeletal: Negative for back pain.  Neurological: Negative for dizziness and weakness.   Maternal Medical History:  Reason for admission: Rupture of membranes.  Nausea.  Contractions: Onset was 3-5 hours ago.   Frequency: irregular.   Perceived severity is mild.    Fetal activity: Perceived fetal activity is normal.   Last perceived fetal movement was within the past hour.    Prenatal complications: No bleeding, PIH, infection, IUGR, placental abnormality, pre-eclampsia or preterm labor.   MSAF   Prenatal Complications - Diabetes: none.      Blood pressure 119/79, pulse 95, resp. rate 16, height 5\' 7"  (1.702 m), weight 114.3 kg, last menstrual period 04/13/2019. Maternal Exam:  Uterine Assessment: Contraction strength is mild.  Contraction frequency is irregular and rare.   Abdomen: Patient reports no abdominal tenderness. Estimated fetal weight is 7.   Fetal presentation: vertex  Introitus: Normal vulva. Normal vagina.  Ferning test: positive.  Nitrazine test: not done. Amniotic fluid character: meconium stained.  Pelvis: adequate for delivery.   Cervix: Cervix evaluated by digital exam.     Fetal Exam Fetal Monitor Review: Mode: ultrasound.   Baseline rate: 145.  Variability: moderate (6-25 bpm).   Pattern: accelerations present and no decelerations.    Fetal State Assessment: Category I - tracings are normal.     Physical Exam  Constitutional: She is oriented to person, place, and time. She appears well-developed and  well-nourished. No distress.  HENT:  Head: Normocephalic.  Cardiovascular: Normal rate.  Respiratory: Effort normal. No respiratory distress.  GI: Soft. She exhibits no distension. There is no abdominal tenderness. There is no rebound and no guarding.  Genitourinary:    Vulva normal.     Genitourinary Comments: Dilation: 1 Effacement (%): 80 Station: -1 Presentation: Vertex Exam by:: Hansel Feinstein, CNM    Musculoskeletal:        General: Normal range of motion.  Neurological: She is alert and oriented to person, place, and time.  Skin: Skin is warm and dry.  Psychiatric: She has a normal mood and affect.    Prenatal labs: ABO, Rh: --/--/A POS, A POS Performed at [facility], [place] [address] [place],  [address]  [phone]) Antibody: NEG (01/07 0541) Rubella: 2.76 (09/15 1338) RPR: NON-REACTIVE (01/15 0857)  HBsAg: NON-REACTIVE (09/15 1338)  HIV: NON-REACTIVE (01/15 0857)  GBS:     Assessment/Plan: Single IUP at [redacted]w[redacted]d PROM at term Light meconium stained fluid Latent labor  Admit to Labor and Delivery Routine orders Foley balloon to be inserted Will start Pitocin augmentation should patient decide to  Hansel Feinstein 01/12/2020, 11:35 PM

## 2020-01-13 ENCOUNTER — Encounter (HOSPITAL_COMMUNITY): Payer: Self-pay | Admitting: Family Medicine

## 2020-01-13 ENCOUNTER — Inpatient Hospital Stay (HOSPITAL_COMMUNITY): Payer: BC Managed Care – PPO | Admitting: Anesthesiology

## 2020-01-13 ENCOUNTER — Other Ambulatory Visit: Payer: Self-pay

## 2020-01-13 LAB — TYPE AND SCREEN
ABO/RH(D): A POS
Antibody Screen: NEGATIVE

## 2020-01-13 LAB — CBC
HCT: 35.3 % — ABNORMAL LOW (ref 36.0–46.0)
Hemoglobin: 11.8 g/dL — ABNORMAL LOW (ref 12.0–15.0)
MCH: 29.6 pg (ref 26.0–34.0)
MCHC: 33.4 g/dL (ref 30.0–36.0)
MCV: 88.5 fL (ref 80.0–100.0)
Platelets: 217 10*3/uL (ref 150–400)
RBC: 3.99 MIL/uL (ref 3.87–5.11)
RDW: 13.4 % (ref 11.5–15.5)
WBC: 10.8 10*3/uL — ABNORMAL HIGH (ref 4.0–10.5)
nRBC: 0 % (ref 0.0–0.2)

## 2020-01-13 LAB — RPR: RPR Ser Ql: NONREACTIVE

## 2020-01-13 LAB — SARS CORONAVIRUS 2 (TAT 6-24 HRS): SARS Coronavirus 2: NEGATIVE

## 2020-01-13 MED ORDER — FENTANYL CITRATE (PF) 100 MCG/2ML IJ SOLN
100.0000 ug | INTRAMUSCULAR | Status: DC | PRN
  Administered 2020-01-13: 100 ug via INTRAVENOUS

## 2020-01-13 MED ORDER — EPHEDRINE 5 MG/ML INJ: 10.0000 mg | INTRAVENOUS | Status: DC | PRN

## 2020-01-13 MED ORDER — FENTANYL CITRATE (PF) 100 MCG/2ML IJ SOLN
INTRAMUSCULAR | Status: AC
  Filled 2020-01-13: qty 2

## 2020-01-13 MED ORDER — OXYTOCIN 40 UNITS IN NORMAL SALINE INFUSION - SIMPLE MED
1.0000 m[IU]/min | INTRAVENOUS | Status: DC
  Administered 2020-01-13: 2 m[IU]/min via INTRAVENOUS
  Filled 2020-01-13: qty 1000

## 2020-01-13 MED ORDER — DIPHENHYDRAMINE HCL 50 MG/ML IJ SOLN: 12.5000 mg | INTRAMUSCULAR | Status: DC | PRN

## 2020-01-13 MED ORDER — LACTATED RINGERS IV SOLN
500.0000 mL | Freq: Once | INTRAVENOUS | Status: AC
  Administered 2020-01-13: 500 mL via INTRAVENOUS

## 2020-01-13 MED ORDER — TERBUTALINE SULFATE 1 MG/ML IJ SOLN: 0.2500 mg | Freq: Once | INTRAMUSCULAR | Status: DC | PRN

## 2020-01-13 MED ORDER — FENTANYL-BUPIVACAINE-NACL 0.5-0.125-0.9 MG/250ML-% EP SOLN
12.0000 mL/h | EPIDURAL | Status: DC | PRN
  Filled 2020-01-13 (×2): qty 250

## 2020-01-13 MED ORDER — PHENYLEPHRINE 40 MCG/ML (10ML) SYRINGE FOR IV PUSH (FOR BLOOD PRESSURE SUPPORT)
80.0000 ug | PREFILLED_SYRINGE | INTRAVENOUS | Status: DC | PRN
  Filled 2020-01-13: qty 10

## 2020-01-13 MED ORDER — PHENYLEPHRINE 40 MCG/ML (10ML) SYRINGE FOR IV PUSH (FOR BLOOD PRESSURE SUPPORT)
80.0000 ug | PREFILLED_SYRINGE | INTRAVENOUS | Status: DC | PRN
  Administered 2020-01-13 (×2): 80 ug via INTRAVENOUS

## 2020-01-13 MED ORDER — LIDOCAINE HCL (PF) 1 % IJ SOLN
INTRAMUSCULAR | Status: DC | PRN
  Administered 2020-01-13: 10 mL via EPIDURAL
  Administered 2020-01-13: 2 mL via EPIDURAL

## 2020-01-13 MED ORDER — SODIUM CHLORIDE (PF) 0.9 % IJ SOLN
INTRAMUSCULAR | Status: DC | PRN
  Administered 2020-01-13: 12 mL/h via EPIDURAL

## 2020-01-13 NOTE — Progress Notes (Signed)
Labor Progress Note Rebekah Hicks is a 34 y.o. G1P0000 at [redacted]w[redacted]d presented for PROM. S: Comfortable with epidural. Not feeling pressure.   O:  BP (!) 92/56   Pulse 70   Temp 97.8 F (36.6 C) (Oral)   Resp 18   Ht 5\' 7"  (1.702 m)   Wt 114.3 kg   LMP 04/13/2019   SpO2 97%   BMI 39.47 kg/m  EFM: 120, moderate variability, pos accels, one decel after exam, reactive TOCO: q2-52m  CVE: Dilation: 6.5 Effacement (%): 80, 90 Station: -1 Presentation: Vertex Exam by:: Dr. [email or web address]   [name]: 34 y.o. G1P0000 [redacted]w[redacted]d here for PROM. #Labor: Progressing well. S/p FB. Pit was started overnight. IUPC in place with adequate MVU's for last 4 hours. Some cervical change with this exam, continue position changes. Anticipate SVD. #Pain: epidural #FWB: Cat I expect for decel with exam.  #GBS negative  [redacted]w[redacted]d, MD 6:21 PM

## 2020-01-13 NOTE — Progress Notes (Signed)
Patient ID: Rebekah Hicks, female   DOB: Apr 05, 1986, 34 y.o.   MRN: [id number] Doing well  Vitals:   01/13/20 0113 01/13/20 0159 01/13/20 0347 01/13/20 0503  BP: 131/79 114/81 116/80 105/71  Pulse: 77 70 67 72  Resp: 15 16 15 15   Temp:   97.8 F (36.6 C)   TempSrc:   Oral   Weight:      Height:       FHR reassuring with low baseline 120s. Throughout UCs irregular  Pitocin infusing since 0200  Dilation: 4.5 Effacement (%): 80 Station: -1 Presentation: Vertex Exam by:: [email or web address], RN  Will continue to observe

## 2020-01-13 NOTE — Progress Notes (Signed)
Labor Progress Note Rebekah Hicks is a 34 y.o. G1P0000 at [redacted]w[redacted]d presented for PROM.  S: feeling comfortable.  No complaints  O:  BP 114/70   Pulse 80   Temp 97.8 F (36.6 C) (Oral)   Resp 15   Ht 5\' 7"  (1.702 m)   Wt 114.3 kg   LMP 04/13/2019   SpO2 97%   BMI 39.47 kg/m  EFM: 130/accels, few early decels, mod variability/q3-4 minute  CVE: Dilation: Lip/rim Effacement (%): 80, 90 Station: 0 Presentation: Vertex Exam by:: [email or web address], CNM   [name]: 34 y.o. G1P0000 [redacted]w[redacted]d here for PROM. Currently 28 hrs since ROM at 4/1 @ 1800. Expect to deliver soon.  #Labor: Progressing well. Currently 9.5cm dilated, zero station.   #Pain: epidural.   #FWB: cat I #GBS negative  [redacted]w[redacted]d, MD 10:15 PM

## 2020-01-13 NOTE — Progress Notes (Signed)
Labor Progress Note Rebekah Hicks is a 34 y.o. G1P0000 at [redacted]w[redacted]d presented for PROM. S: Comfortable with epidural.   O:  BP 112/63   Pulse 66   Temp 97.9 F (36.6 C)   Resp 16   Ht 5\' 7"  (1.702 m)   Wt 114.3 kg   LMP 04/13/2019   SpO2 97%   BMI 39.47 kg/m  EFM: 120, moderate variability, pos accels, no decels, reactive TOCO: difficult to trace  CVE: Dilation: (P) 6 Effacement (%): (P) 80, 90 Station: (P) -1 Presentation: (P) Vertex Exam by:: (P) Dr. [email or web address]   [name]: 34 y.o. G1P0000 [redacted]w[redacted]d here for PROM. #Labor: Progressing well. S/p FB. Pit was started overnight. IUPC placed left anterior to better titrate pit, titrate to adequate MVU's. Anticipate SVD. #Pain: per patient request #FWB: Cat I #GBS negative  [redacted]w[redacted]d, MD 2:12 PM

## 2020-01-13 NOTE — Progress Notes (Signed)
Labor Progress Note Rebekah Hicks is a 34 y.o. G1P0000 at [redacted]w[redacted]d presented for PROM. S: Comfortable without epidural currently but feeling ctx.   O:  BP 101/69   Pulse 74   Temp 97.9 F (36.6 C) (Oral)   Resp 16   Ht 5\' 7"  (1.702 m)   Wt 114.3 kg   LMP 04/13/2019   BMI 39.47 kg/m  EFM: 120, moderate variability, pos accels, no decels, reactive TOCO: q3-38m  CVE: Dilation: 5.5 Effacement (%): 80 Station: -1 Presentation: Vertex Exam by:: [email or web address] RN   [name]: 34 y.o. G1P0000 [redacted]w[redacted]d here for PROM. #Labor: Progressing well. S/p FB. Pit was started overnight. Will plan to recheck and insert IUPC if no change. Fore-bag rupture PRN. Anticipate SVD. #Pain: per patient request #FWB: Cat I #GBS negative  09-03-1986, MD 9:28 AM

## 2020-01-13 NOTE — Anesthesia Procedure Notes (Signed)
Epidural Patient location during procedure: OB Start time: 01/13/2020 12:04 PM End time: 01/13/2020 12:13 PM  Staffing Anesthesiologist: Lannie Fields, DO Performed: anesthesiologist   Preanesthetic Checklist Completed: patient identified, IV checked, risks and benefits discussed, monitors and equipment checked, pre-op evaluation and timeout performed  Epidural Patient position: sitting Prep: DuraPrep and site prepped and draped Patient monitoring: continuous pulse ox, blood pressure, heart rate and cardiac monitor Approach: midline Location: L3-L4 Injection technique: LOR air  Needle:  Needle type: Tuohy  Needle gauge: 17 G Needle length: 9 cm Needle insertion depth: 6 cm Catheter type: closed end flexible Catheter size: 19 Gauge Catheter at skin depth: 12 cm Test dose: negative  Assessment Sensory level: T8 Events: blood not aspirated, injection not painful, no injection resistance, no paresthesia and negative IV test  Additional Notes Patient identified. Risks/Benefits/Options discussed with patient including but not limited to bleeding, infection, nerve damage, paralysis, failed block, incomplete pain control, headache, blood pressure changes, nausea, vomiting, reactions to medication both or allergic, itching and postpartum back pain. Confirmed with bedside nurse the patient's most recent platelet count. Confirmed with patient that they are not currently taking any anticoagulation, have any bleeding history or any family history of bleeding disorders. Patient expressed understanding and wished to proceed. All questions were answered. Sterile technique was used throughout the entire procedure. Please see nursing notes for vital signs. Test dose was given through epidural catheter and negative prior to continuing to dose epidural or start infusion. Warning signs of high block given to the patient including shortness of breath, tingling/numbness in hands, complete motor  block, or any concerning symptoms with instructions to call for help. Patient was given instructions on fall risk and not to get out of bed. All questions and concerns addressed with instructions to call with any issues or inadequate analgesia.  Reason for block:procedure for pain

## 2020-01-13 NOTE — Anesthesia Preprocedure Evaluation (Addendum)
Anesthesia Evaluation  Patient identified by MRN, date of birth, ID band Patient awake    Reviewed: Allergy & Precautions, Patient's Chart, lab work & pertinent test results  Airway Mallampati: II  TM Distance: >3 FB Neck ROM: Full    Dental no notable dental hx.    Pulmonary neg pulmonary ROS,    Pulmonary exam normal breath sounds clear to auscultation       Cardiovascular negative cardio ROS Normal cardiovascular exam Rhythm:Regular Rate:Normal     Neuro/Psych  Headaches, negative psych ROS   GI/Hepatic negative GI ROS, Neg liver ROS,   Endo/Other  Morbid obesityBMI 40  Renal/GU negative Renal ROS  negative genitourinary   Musculoskeletal negative musculoskeletal ROS (+)   Abdominal (+) + obese,   Peds negative pediatric ROS (+)  Hematology negative hematology ROS (+)   Anesthesia Other Findings   Reproductive/Obstetrics (+) Pregnancy                             Anesthesia Physical Anesthesia Plan  ASA: III and emergent  Anesthesia Plan: Epidural   Post-op Pain Management:    Induction:   PONV Risk Score and Plan: 2  Airway Management Planned: Natural Airway  Additional Equipment: None  Intra-op Plan:   Post-operative Plan:   Informed Consent: I have reviewed the patients History and Physical, chart, labs and discussed the procedure including the risks, benefits and alternatives for the proposed anesthesia with the patient or authorized representative who has indicated his/her understanding and acceptance.       Plan Discussed with:   Anesthesia Plan Comments:         Anesthesia Quick Evaluation

## 2020-01-14 ENCOUNTER — Encounter (HOSPITAL_COMMUNITY): Payer: Self-pay | Admitting: Family Medicine

## 2020-01-14 DIAGNOSIS — O42913 Preterm premature rupture of membranes, unspecified as to length of time between rupture and onset of labor, third trimester: Secondary | ICD-10-CM

## 2020-01-14 DIAGNOSIS — Z3A39 39 weeks gestation of pregnancy: Secondary | ICD-10-CM

## 2020-01-14 MED ORDER — TETANUS-DIPHTH-ACELL PERTUSSIS 5-2.5-18.5 LF-MCG/0.5 IM SUSP: 0.5000 mL | Freq: Once | INTRAMUSCULAR | Status: DC

## 2020-01-14 MED ORDER — DIBUCAINE (PERIANAL) 1 % EX OINT: 1.0000 "application " | TOPICAL_OINTMENT | CUTANEOUS | Status: DC | PRN

## 2020-01-14 MED ORDER — WITCH HAZEL-GLYCERIN EX PADS: 1.0000 "application " | MEDICATED_PAD | CUTANEOUS | Status: DC | PRN

## 2020-01-14 MED ORDER — ONDANSETRON HCL 4 MG/2ML IJ SOLN: 4.0000 mg | INTRAMUSCULAR | Status: DC | PRN

## 2020-01-14 MED ORDER — DIPHENHYDRAMINE HCL 25 MG PO CAPS: 25.0000 mg | ORAL_CAPSULE | Freq: Four times a day (QID) | ORAL | Status: DC | PRN

## 2020-01-14 MED ORDER — PRENATAL MULTIVITAMIN CH
1.0000 | ORAL_TABLET | Freq: Every day | ORAL | Status: DC
  Administered 2020-01-14 – 2020-01-16 (×3): 1 via ORAL
  Filled 2020-01-14 (×3): qty 1

## 2020-01-14 MED ORDER — ZOLPIDEM TARTRATE 5 MG PO TABS: 5.0000 mg | ORAL_TABLET | Freq: Every evening | ORAL | Status: DC | PRN

## 2020-01-14 MED ORDER — ONDANSETRON HCL 4 MG PO TABS: 4.0000 mg | ORAL_TABLET | ORAL | Status: DC | PRN

## 2020-01-14 MED ORDER — SENNOSIDES-DOCUSATE SODIUM 8.6-50 MG PO TABS
2.0000 | ORAL_TABLET | ORAL | Status: DC
  Administered 2020-01-15: 2 via ORAL
  Filled 2020-01-14 (×2): qty 2

## 2020-01-14 MED ORDER — COCONUT OIL OIL: 1.0000 "application " | TOPICAL_OIL | Status: DC | PRN

## 2020-01-14 MED ORDER — ACETAMINOPHEN 325 MG PO TABS: 650.0000 mg | ORAL_TABLET | ORAL | Status: DC | PRN

## 2020-01-14 MED ORDER — BENZOCAINE-MENTHOL 20-0.5 % EX AERO
1.0000 "application " | INHALATION_SPRAY | CUTANEOUS | Status: DC | PRN
  Administered 2020-01-14: 1 via TOPICAL
  Filled 2020-01-14: qty 56

## 2020-01-14 MED ORDER — SIMETHICONE 80 MG PO CHEW: 80.0000 mg | CHEWABLE_TABLET | ORAL | Status: DC | PRN

## 2020-01-14 MED ORDER — IBUPROFEN 600 MG PO TABS
600.0000 mg | ORAL_TABLET | Freq: Four times a day (QID) | ORAL | Status: DC
  Administered 2020-01-14 – 2020-01-16 (×7): 600 mg via ORAL
  Filled 2020-01-14 (×9): qty 1

## 2020-01-14 NOTE — Consult Note (Signed)
Neonatology Note:   Attendance at Delivery:    I was asked by Dr. Aundria Rud to attend this NSVD at term for meconium. The mother is a G1, GBS neg with good prenatal care neg. ROM 34h 62m prior to delivery, fluid clear. Infant fairly vigorous with fairly good spontaneous cry and tone. Needed much bulb suctioning to clear nares and oropharynx.  Ap 8/9. Lungs clearing to ausc, pink, good tone and activity in DR. Family updated.  To CN to care of Pediatrician.  Dineen Kid Leary Roca, MD

## 2020-01-14 NOTE — Plan of Care (Signed)

## 2020-01-14 NOTE — Discharge Summary (Addendum)
Postpartum Discharge Summary       Patient Name: Rebekah Hicks DOB: [birth year] MRN: [id number]  Date of admission: 01/12/2020 Delivering Provider: Lajean Manes   Date of discharge: 01/16/2020  Admitting diagnosis: Post-dates pregnancy [O48.0] Intrauterine pregnancy: [redacted]w[redacted]d    Secondary diagnosis:  Active Problems:   Supervision of normal pregnancy   Obesity during pregnancy, antepartum   Post-dates pregnancy   SVD (spontaneous vaginal delivery)  Additional problems: none     Discharge diagnosis: Term Pregnancy Delivered                                                                                                Post partum procedures:None  Augmentation: Pitocin and Foley Balloon  Complications: ROHY>07hours  Hospital course:  Onset of Labor With Vaginal Delivery     34y.o. yo G1P0000 at 336w3das admitted in Latent Labor on 01/12/2020. Patient had an uncomplicated labor course as follows:  Membrane Rupture Time/Date: 6:00 PM ,01/12/2020   Intrapartum Procedures: Episiotomy: None [1]                                         Lacerations:  Labial [10]  Patient had a delivery of a Viable infant. 01/14/2020  Information for the patient's newborn:  Rebekah Hicks, [place]0[[id number]]Delivery Method: VaFiskad an uncomplicated postpartum course.  She is ambulating, tolerating a regular diet, passing flatus, and urinating well. Patient is discharged home in stable condition on 01/16/20.  Delivery time: 4:34 AM    Magnesium Sulfate received: No BMZ received: No Rhophylac:N/A MMR:N/A Transfusion:No  Physical exam  Vitals:   01/15/20 0416 01/15/20 1415 01/15/20 2227 01/16/20 0522  BP: 109/80 107/76 105/77 105/78  Pulse: 74 66 63 60  Resp: '18 16 16 18  ' Temp: 97.6 F (36.4 C) 98.4 F (36.9 C) 97.7 F (36.5 C) 97.8 F (36.6 C)  TempSrc: Oral Oral Oral Oral  SpO2:  98% 98%   Weight:      Height:       General: alert and no distress Lochia:  appropriate Uterine Fundus: firm Incision: N/A DVT Evaluation: No evidence of DVT seen on physical exam. No cords or calf tenderness. Labs: Lab Results  Component Value Date   WBC 10.8 (H) 01/12/2020   HGB 11.8 (L) 01/12/2020   HCT 35.3 (L) 01/12/2020   MCV 88.5 01/12/2020   PLT 217 01/12/2020   CMP Latest Ref Rng & Units 06/28/2019  Glucose 65 - 99 mg/dL 72  BUN 7 - 25 mg/dL 7  Creatinine 0.50 - 1.10 mg/dL 0.55  Sodium 135 - 146 mmol/L 138  Potassium 3.5 - 5.3 mmol/L 3.9  Chloride 98 - 110 mmol/L 104  CO2 20 - 32 mmol/L 25  Calcium 8.6 - 10.2 mg/dL 9.3  Total Protein 6.1 - 8.1 g/dL 6.9  Total Bilirubin 0.2 - 1.2 mg/dL 0.3  Alkaline Phos 33 - 115 U/L -  AST 10 - 30 U/L  12  ALT 6 - 29 U/L 9   Edinburgh Score: Edinburgh Postnatal Depression Scale Screening Tool 01/15/2020  I have been able to laugh and see the funny side of things. 0  I have looked forward with enjoyment to things. 0  I have blamed myself unnecessarily when things went wrong. 1  I have been anxious or worried for no good reason. 1  I have felt scared or panicky for no good reason. 0  Things have been getting on top of me. 0  I have been so unhappy that I have had difficulty sleeping. 0  I have felt sad or miserable. 0  I have been so unhappy that I have been crying. 1  The thought of harming myself has occurred to me. 0  Edinburgh Postnatal Depression Scale Total 3    Discharge instruction: per After Visit Summary and "Baby and Me Booklet".  After visit meds:  Allergies as of 01/16/2020   No Known Allergies     Medication List    STOP taking these medications   aspirin EC 81 MG tablet   PRENATAL VITAMIN PO       Diet: routine diet  Activity: Advance as tolerated. Pelvic rest for 6 weeks.   Outpatient follow up:4 weeks Follow up Appt: Future Appointments  Date Time Provider [place]  02/14/2020  8:55 AM Lajean Manes, CNM [facility]   Follow up Visit:    Please  schedule this patient for Postpartum visit in: 4 weeks with the following provider: Any provider In-Person For C/S patients schedule nurse incision check in weeks 2 weeks: no Low risk pregnancy complicated by:  nothing Delivery mode:  SVD Anticipated Birth Control:  OCPs PP Procedures needed:  none   Schedule Integrated BH visit: no     Newborn Data: Live born female  Birth Weight: 7 lb 6.3 oz (3355 g) APGAR: 8, 9  Newborn Delivery   Birth date/time: 01/14/2020 04:34:00 Delivery type: Vaginal, Spontaneous      Baby Feeding: Breast Disposition:home with mother   01/16/2020 Rebekah Hensen, DO  Provider attestation I have seen and examined this patient and agree with above documentation in the resident's note.   Rebekah Hicks is a 34 y.o. G1P1001 s/p SVD.  Pain is well controlled. Plan for birth control is oral contraceptives (estrogen/progesterone). Method of Feeding: breast  PE:  Gen: well appearing Heart: reg rate Lungs: normal WOB Fundus firm Ext: no pain, no edema  No results for input(s): HGB, HCT in the last 72 hours.  Assessment S/p SVD PPD # 2  Plan: - discharge home - postpartum care discussed - f/u in office in 6 weeks for postpartum visit   Rebekah Guild, NP 10:11 AM

## 2020-01-14 NOTE — Lactation Note (Signed)
This note was copied from a baby's chart. Lactation Consultation Note  Patient Name: Rebekah Hicks HUTML'Y Date: 01/14/2020 Reason for consult: Initial assessment;1st time breastfeeding;Term P1, 15 hour term female infant, infant had meconium stool at delivery. Per mom, she does have Medela DEBP at home. Tools given: mom was given hand pump due having flat nipples to help evert nipple shaft out more and breast shells to wear during the day and not at night by RN. Mom feels as infant is started to latch more, they both are improving with breastfeeding. LC ask mom to do breast stimulation and squeeze small amount of colostrum out of breast prior to latching infant, mom latched infant on left breast using the football hold position, nose and chin was touching breast, swallows observed, infant sustained latch and breastfed for 15 minutes. Per mom, this is the longest infant has latched, LC discussed with mom to keep infant stimulated at breast to nurse, by talking to infant, rubbing check, neck and shoulder.  Mom knows to help evert nipple shaft out more is to do breast stimulation and pre-pump breast prior to latching infant. LC reviewed hand expression and mom taught back, after  infant latched at breast, infant was given 3 mls of colostrum by spoon. Mom understands to breastfeed infant according to hunger cues, 8 to 12 times within 24 hours, not to exceed 3 hours without breastfeeding infant. Dad was doing STS when [facility] left room and parents will do as much STS with infant as possible. Mom knows to call RN or LC if she has any questions, concerns or needs assistance with latching infant at breast.  Reviewed Baby & Me book's Breastfeeding Basics.  LC discussed breastfeeding resources after discharge: LC hotline, LC outpatient clinic and [facility] online support group.   Maternal Data Formula Feeding for Exclusion: No Has patient been taught Hand Expression?: Yes Does the patient have breastfeeding  experience prior to this delivery?: No  Feeding Feeding Type: Breast Fed  LATCH Score Latch: Grasps breast easily, tongue down, lips flanged, rhythmical sucking.  Audible Swallowing: Spontaneous and intermittent  Type of Nipple: Flat  Comfort (Breast/Nipple): Soft / non-tender  Hold (Positioning): Assistance needed to correctly position infant at breast and maintain latch.  LATCH Score: 8  Interventions Interventions: Breast feeding basics reviewed;Breast compression;Adjust position;Hand pump;Assisted with latch;Skin to skin;Support pillows;Position options;Breast massage;Hand express;Expressed milk;Pre-pump if needed;Shells  Lactation Tools Discussed/Used Tools: Shells Shell Type: Other (comment)(flat nipples) WIC Program: No Pump Review: Setup, frequency, and cleaning;Milk Storage Initiated by:: RN Date initiated:: 01/14/20   Consult Status Consult Status: Follow-up Date: 01/15/20 Follow-up type: In-patient    Danelle Earthly 01/14/2020, 8:26 PM

## 2020-01-14 NOTE — Progress Notes (Signed)
Late entry:   LABOR PROGRESS NOTE  Rebekah Hicks is a 34 y.o. G1P0000 at [redacted]w[redacted]d  admitted for PROM on 4/2.  Subjective: Patient currently pushing with contractions   Objective: BP 108/68   Pulse 89   Temp 98.3 F (36.8 C) (Oral)   Resp 17   Ht 5\' 7"  (1.702 m)   Wt 114.3 kg   LMP 04/13/2019   SpO2 98%   BMI 39.47 kg/m  or  Vitals:   01/14/20 0445 01/14/20 0501 01/14/20 0516 01/14/20 0530  BP: 117/70 116/66 105/78 108/68  Pulse: 76 (!) 119 (!) 101 89  Resp: 15 14 15 17   Temp:      TempSrc:      SpO2:      Weight:      Height:        Dilation: 10 Dilation Complete Date: 01/13/20 Dilation Complete Time: 2340 Effacement (%): 80, 90 Station: 0, Plus 1 Presentation: Vertex Exam by:: 03/14/20, RN FHT: baseline rate 125, min-mod varibility, no accel, variable decel Toco: 2-5  Labs: Lab Results  Component Value Date   WBC 10.8 (H) 01/12/2020   HGB 11.8 (L) 01/12/2020   HCT 35.3 (L) 01/12/2020   MCV 88.5 01/12/2020   PLT 217 01/12/2020    Patient Active Problem List   Diagnosis Date Noted  . SVD (spontaneous vaginal delivery) 01/14/2020  . Post-dates pregnancy 01/12/2020  . Traumatic injury during pregnancy in second trimester 10/19/2019  . Supervision of normal pregnancy 06/28/2019  . Obesity during pregnancy, antepartum 06/28/2019  . 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 Painful intercourse 02/22/2014  . Postcoital bleeding 02/22/2014  . Hyperlipidemia, mixed 05/23/2013    Assessment / Plan: 34 y.o. G1P0000 at [redacted]w[redacted]d here for PROM   Labor: Pushing with contractions, plan for delivery soon  Fetal Wellbeing:  Cat II  Pain Control:  Epidural  Anticipated MOD:  SVD  32, CNM 01/14/2020, 5:51 AM

## 2020-01-14 NOTE — Plan of Care (Signed)
  Problem: Education: Goal: Knowledge of condition will improve Outcome: Completed/Met   Problem: Activity: Goal: Will verbalize the importance of balancing activity with adequate rest periods Outcome: Completed/Met Goal: Ability to tolerate increased activity will improve Outcome: Completed/Met   Problem: Life Cycle: Goal: Chance of risk for complications during the postpartum period will decrease Outcome: Completed/Met   Problem: Role Relationship: Goal: Ability to demonstrate positive interaction with newborn will improve Outcome: Completed/Met   Problem: Skin Integrity: Goal: Demonstration of wound healing without infection will improve Outcome: Completed/Met   

## 2020-01-14 NOTE — Anesthesia Postprocedure Evaluation (Signed)
Anesthesia Post Note  Patient: [name]  Procedure(s) Performed: AN AD HOC LABOR EPIDURAL     Patient location during evaluation: Mother Baby Anesthesia Type: Epidural Level of consciousness: awake and alert Pain management: pain level controlled Respiratory status: spontaneous breathing Cardiovascular status: stable Postop Assessment: no headache, adequate PO intake, no backache, patient able to bend at knees, able to ambulate, epidural receding and no apparent nausea or vomiting Anesthetic complications: no    Last Vitals:  Vitals:   01/14/20 0601 01/14/20 0638  BP: 108/62 112/66  Pulse: 78 78  Resp: 17 18  Temp:  (!) 36.4 C  SpO2:  98%    Last Pain:  Vitals:   01/14/20 0640  TempSrc:   PainSc: 0-No pain   Pain Goal:                Epidural/Spinal Function Cutaneous sensation: Tingles (01/14/20 0640), Patient able to flex knees: Yes (01/14/20 0640), Patient able to lift hips off bed: Yes (01/14/20 0640), Back pain beyond tenderness at insertion site: No (01/14/20 0640), Progressively worsening motor and/or sensory loss: No (01/14/20 0640), Bowel and/or bladder incontinence post epidural: No (01/14/20 0640)  Rebekah Hicks

## 2020-01-15 ENCOUNTER — Encounter (HOSPITAL_COMMUNITY): Payer: Self-pay | Admitting: Family Medicine

## 2020-01-15 NOTE — Progress Notes (Signed)
POSTPARTUM PROGRESS NOTE  Post Partum Day 1  Subjective:  Rebekah Hicks is a 34 y.o. G1P1001 s/p SVD at [redacted]w[redacted]d.  No acute events overnight.  Pt denies problems with ambulating, voiding or po intake.  She denies nausea or vomiting.  Pain is well controlled.  She has had flatus. She has not had bowel movement.  Lochia Moderate.   Objective: Blood pressure 109/80, pulse 74, temperature 97.6 F (36.4 C), temperature source Oral, resp. rate 18, height 5\' 7"  (1.702 m), weight 114.3 kg, last menstrual period 04/13/2019, SpO2 98 %, unknown if currently breastfeeding.  Physical Exam:  General: alert, cooperative and no distress Chest: no respiratory distress Heart:regular rate, distal pulses intact Abdomen: soft, nontender,  Uterine Fundus: firm, appropriately tender DVT Evaluation: No calf swelling or tenderness Extremities: no edema Skin: warm, dry  Recent Labs    01/12/20 2340  HGB 11.8*  HCT 35.3*    Assessment/Plan: Rebekah Hicks is a 34 y.o. G1P1001 s/p SVD at [redacted]w[redacted]d   PPD#1 - Doing well Contraception: unsure at this time  Feeding: breast  Dispo: Plan for discharge tomorrow.   LOS: 3 days   [redacted]w[redacted]d 01/15/2020, 8:35 AM

## 2020-01-15 NOTE — Lactation Note (Signed)
This note was copied from a baby's chart. Lactation Consultation Note  Patient Name: Rebekah Hicks PCWTP'E Date: 01/15/2020 Reason for consult: Follow-up assessment;Primapara;Term Baby is 29 hours old/3% weight loss.  Baby is currently in the nursery for circumcision.  Mom states it can take a while to obtain a latch due to flat nipples.  She states once he latches his does well.  Mom may be discharged today.  Instructed to call for LC to assist when baby is ready to feed.  Maternal Data    Feeding Feeding Type: Breast Fed  LATCH Score                   Interventions    Lactation Tools Discussed/Used     Consult Status Consult Status: Follow-up Date: 01/16/20 Follow-up type: In-patient    [name] 01/15/2020, 10:18 AM

## 2020-01-15 NOTE — Lactation Note (Signed)
This note was copied from a baby's chart. Lactation Consultation Note  Patient Name: Rebekah Hicks YHCWC'B Date: 01/15/2020 Reason for consult: Follow-up assessment;Difficult latch Baby is 53 hours old.  He is starting to wake up and show signs of hunger.  Assisted with positioning baby in cross cradle hold on left side.  Hand expression done but no milk expressed.  Baby sleepy at breast and unable to sustain a latch.  24 mm nipple shield applied after suck training on gloved finger.  Baby developed a strong suck on finger.  He latched well after a few attempts and fed for 15 minutes.  No colostrum seen in shield and minimal swallows with feeding.  After 15 minutes on left assisted with football hold on right.  First attempted without shield but no suck elicited.  24 mm nipple shield applied and baby latched well and sucked with strong pull for 5 minutes.  Baby fell asleep at breast.  Explained to mom that initiating pumping with DEBP will help establish a good milk supply.  Mom wanted to use her personal Medela pump.  Pump set up and initiated.  Instructed to feed with cues, post pump every 3 hours x 15 minutes and finger feed colostrum obtained.  Discussed cluster feeding.  Encouraged to call for assist prn.  Report given to RN.  Maternal Data    Feeding Feeding Type: Breast Fed  LATCH Score Latch: Grasps breast easily, tongue down, lips flanged, rhythmical sucking.(with nipple shield)  Audible Swallowing: A few with stimulation  Type of Nipple: Flat  Comfort (Breast/Nipple): Soft / non-tender  Hold (Positioning): Assistance needed to correctly position infant at breast and maintain latch.  LATCH Score: 7  Interventions Interventions: Breast compression;Assisted with latch;Adjust position;Skin to skin;Support pillows;Breast massage;Shells  Lactation Tools Discussed/Used Pump Review: Setup, frequency, and cleaning Initiated by:: [email or web address] Date initiated:: 01/15/20   Consult  Status Consult Status: Follow-up Date: 01/16/20 Follow-up type: In-patient    [name] 01/15/2020, 2:17 PM

## 2020-01-16 MED ORDER — IBUPROFEN 600 MG PO TABS: 600.0000 mg | ORAL_TABLET | Freq: Four times a day (QID) | ORAL | 0 refills | Status: DC

## 2020-01-16 NOTE — Lactation Note (Signed)
This note was copied from a baby's chart. Lactation Consultation Note  Patient Name: Rebekah Hicks [id number] Date: 01/16/2020 Reason for consult: Follow-up assessment   Baby now 68 hours old.  Called to room to observe feeding. Baby has not stooled in more than 24 hours. Plan is for mother to hand express before latching then breastfeed on both breasts and pump after.  Give baby volume pumped back. Mother recently pumped drops.  Mother is using her personal DEBP.  Observed latch.  Baby latches well but is sleepy at the breast. Mother seems like she really wants to be discharged. LC spoke with [name] MD and suggest that if baby has not stooled by this evening, he will need to be supplemented especially if he is going home.  Reviewed engorgement care and monitoring voids/stools.     Maternal Data    Feeding Feeding Type: Breast Fed  LATCH Score Latch: Grasps breast easily, tongue down, lips flanged, rhythmical sucking.  Audible Swallowing: A few with stimulation  Type of Nipple: Everted at rest and after stimulation  Comfort (Breast/Nipple): Soft / non-tender  Hold (Positioning): Assistance needed to correctly position infant at breast and maintain latch.  LATCH Score: 8  Interventions Interventions: DEBP  Lactation Tools Discussed/Used Tools: Nipple Dorris Carnes;Pump   Consult Status Consult Status: Follow-up Date: 01/17/20 Follow-up type: In-patient    Dahlia Byes [facility] 01/16/2020, 2:58 PM

## 2020-01-16 NOTE — Lactation Note (Addendum)
This note was copied from a baby's chart. Lactation Consultation Note  Patient Name: Rebekah Hicks [id number] Date: 01/16/2020 Reason for consult: Follow-up assessment   Baby 53 hours old and using #24NS to latch.  6.6% weight loss.   No stool in more than 24 hours.  Baby was circumcised yesterday  Noted pacifier in crib. Pacifier use not recommended at this time.  Mother has only been breastfeed on one breast per feeding. Mother has pumped 3-4 times using her DEBP. Encouraged mother to call for [facility] to view next feeding. Mother states she was able to take nipple shield off half way through last feeding and infants latched.  Observed breastfeeding and baby will latch without NS. Sucks and swallows observed.  When baby came off, mother latched back with #24NS. Plan is for mother to hand express before latching and compress breast during feeding. Recommend breastfeeding on both breasts per session and post pumping after feedings. Give volume back to baby on spoon.  Sit upright when pumping and occasionally lean forward. Mother has personal DEBP in room. Feed on demand with cues.  Goal 8-12+ times per day after first 24 hrs.  Place baby STS if not cueing.  Reviewed engorgement care and monitoring voids/stools.       Maternal Data    Feeding Feeding Type: Breast Fed  LATCH Score Latch: Grasps breast easily, tongue down, lips flanged, rhythmical sucking.(using NS)  Audible Swallowing: A few with stimulation  Type of Nipple: Everted at rest and after stimulation  Comfort (Breast/Nipple): Soft / non-tender  Hold (Positioning): Assistance needed to correctly position infant at breast and maintain latch.  LATCH Score: 8  Interventions Interventions: Breast feeding basics reviewed  Lactation Tools Discussed/Used Tools: Nipple Dorris Carnes;Pump Nipple shield size: 24   Consult Status Consult Status: Follow-up Date: 01/16/20    Dahlia Byes [facility] 01/16/2020, 10:02  AM

## 2020-01-16 NOTE — Discharge Instructions (Signed)

## 2020-01-17 ENCOUNTER — Encounter: Payer: BC Managed Care – PPO | Admitting: Advanced Practice Midwife

## 2020-01-19 ENCOUNTER — Encounter: Payer: Self-pay | Admitting: Certified Nurse Midwife

## 2020-01-20 ENCOUNTER — Ambulatory Visit: Payer: BC Managed Care – PPO

## 2020-01-23 ENCOUNTER — Encounter (INDEPENDENT_AMBULATORY_CARE_PROVIDER_SITE_OTHER): Payer: Self-pay

## 2020-01-23 ENCOUNTER — Other Ambulatory Visit (HOSPITAL_COMMUNITY): Payer: BC Managed Care – PPO

## 2020-01-25 ENCOUNTER — Inpatient Hospital Stay (HOSPITAL_COMMUNITY): Payer: BC Managed Care – PPO

## 2020-01-25 ENCOUNTER — Inpatient Hospital Stay (HOSPITAL_COMMUNITY)
Admission: AD | Admit: 2020-01-25 | Payer: BC Managed Care – PPO | Source: Home / Self Care | Admitting: Obstetrics & Gynecology

## 2020-02-14 ENCOUNTER — Encounter: Payer: Self-pay | Admitting: Certified Nurse Midwife

## 2020-02-14 ENCOUNTER — Ambulatory Visit (INDEPENDENT_AMBULATORY_CARE_PROVIDER_SITE_OTHER): Payer: BC Managed Care – PPO | Admitting: Certified Nurse Midwife

## 2020-02-14 ENCOUNTER — Other Ambulatory Visit: Payer: Self-pay

## 2020-02-14 DIAGNOSIS — Z30011 Encounter for initial prescription of contraceptive pills: Secondary | ICD-10-CM

## 2020-02-14 MED ORDER — NORGESTIMATE-ETH ESTRADIOL 0.25-35 MG-MCG PO TABS: 1.0000 | ORAL_TABLET | Freq: Every day | ORAL | 3 refills | Status: DC

## 2020-02-14 NOTE — Progress Notes (Signed)
    Post Partum Visit Note  Rebekah Hicks is a 34 y.o. G61P1001 female who presents for a postpartum visit. She is 5 weeks postpartum following a normal spontaneous vaginal delivery.  I have fully reviewed the prenatal and intrapartum course. The delivery was at 39.3 gestational weeks.  Anesthesia: epidural. Postpartum course has been unremarkable. Baby is doing well.[name] is feeding by bottle - [employer]. Bleeding no bleeding. Bowel function is normal. Bladder function is normal. Patient is not sexually active. Contraception method is OCP (estrogen/progesterone). Postpartum depression screening: negative.  The following portions of the patient's history were reviewed and updated as appropriate: allergies, current medications, past medical history and problem list.  Review of Systems Pertinent items noted in HPI and remainder of comprehensive ROS otherwise negative.    Objective:  Blood pressure 100/73, pulse 61, weight 228 lb (103.4 kg), last menstrual period 04/13/2019, not currently breastfeeding.  General:  alert, cooperative and no distress   Breasts:  inspection negative, no nipple discharge or bleeding, no masses or nodularity palpable  Lungs: clear to auscultation bilaterally  Heart:  regular rate and rhythm  Abdomen: soft, non-tender; bowel sounds normal; no masses,  no organomegaly   Vulva:  not evaluated  Vagina: not evaluated  Cervix:  not evaluated  Corpus: not examined  Adnexa:  not evaluated  Rectal Exam: Not performed.        Assessment/Plan:  1. Postpartum care and examination - Normal postpartum exam. Pap smear not done at today's visit. Pap not needed until 2023.  2. Encounter for initial prescription of contraceptive pills - norgestimate-ethinyl estradiol (ORTHO-CYCLEN) 0.25-35 MG-MCG tablet; Take 1 tablet by mouth daily.  Dispense: 3 Package; Refill: 3  Plan:   Essential components of care per ACOG recommendations:  1.  Mood and well being: Patient  with negative depression screening today. Reviewed local resources for support.  - Patient does not use tobacco.  - hx of drug use? No    2. Infant care and feeding:  -Patient currently breastmilk feeding? No  -Social determinants of health (SDOH) reviewed in EPIC. No concerns  3. Sexuality, contraception and birth spacing - Patient does not want a pregnancy in the next year.  Desired family size is unsure at time.  - Reviewed forms of contraception in tiered fashion. Patient desired oral contraceptives (estrogen/progesterone) today.   - Discussed birth spacing of 18 months  4. Sleep and fatigue -Encouraged family/partner/community support of 4 hrs of uninterrupted sleep to help with mood and fatigue  5. Physical Recovery  - Discussed patients delivery and complications - Patient had a labial laceration, perineal healing reviewed. Patient expressed understanding - Patient has urinary incontinence? No - Patient is safe to resume physical and sexual activity  6.  Health Maintenance - Last pap smear done 05/2017 and was normal with negative HPV.   Sharyon Cable, CNM [facility] [employer], [facility]

## 2020-10-13 NOTE — L&D Delivery Note (Addendum)
Delivery Note At 5:04 PM a deceased child was delivered via Vaginal, Spontaneous (Presentation: double footling breech).  APGAR: 0, 0; weight pending .   Placenta status: Spontaneous, Intact.  Cord: 3 vessels   Patient felt overwhelming urge to push. Pushed with SROM of brown fluid. Initially presented as single footling breech. CNM gently swept second leg to introitus. Patient pushed for approximately 10 minutes to deliver. Significant swelling of fetal body noted with very large cystic hygromas.  Anesthesia: Epidural Episiotomy: None Lacerations: None Suture Repair:  n/a Est. Blood Loss (mL): 150  Mom to  [facility] .  Baby to [place].  Rolm Bookbinder CNM 08/03/2021, 5:52 PM

## 2020-10-16 IMAGING — US US MFM OB DETAIL+14 WK
1 series · 13 of 28 positions shown · non-contrast
Comparison: none

[Series 1: us mfm ob detail+14 wk · 13 of 87 slices shown]
[im 4/87]
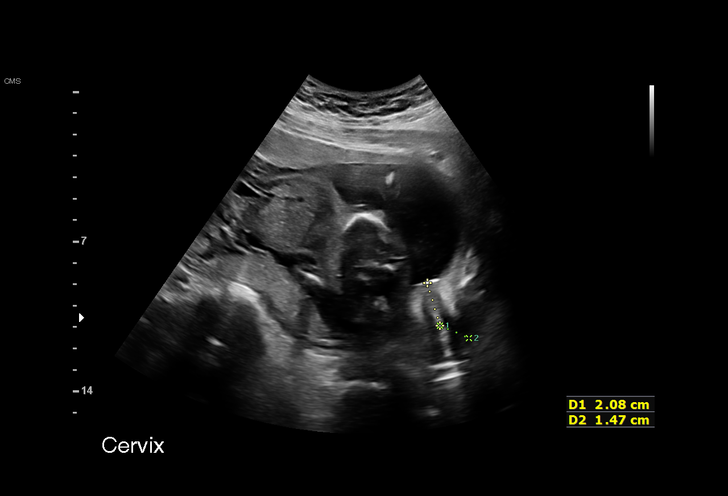
[im 10/87]
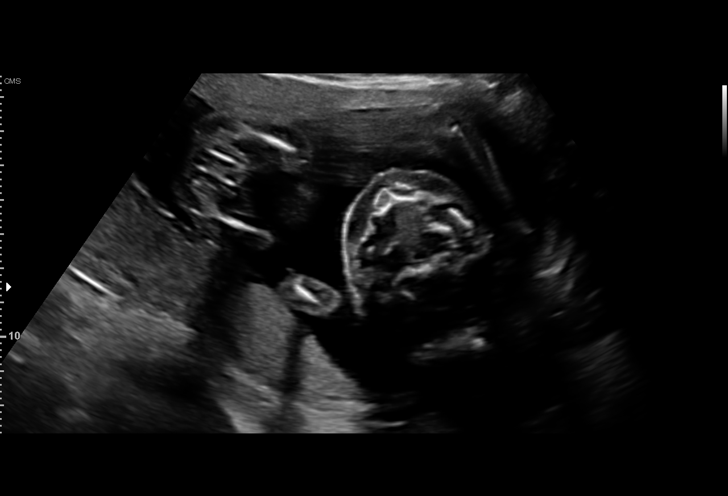
[im 16/87]
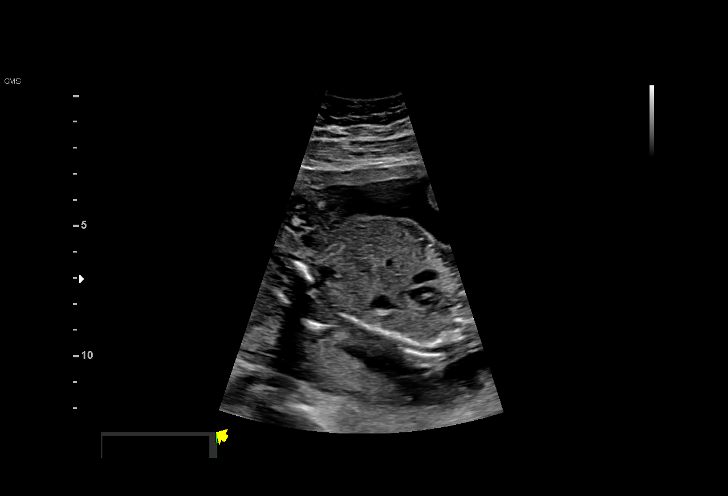
[im 23/87]
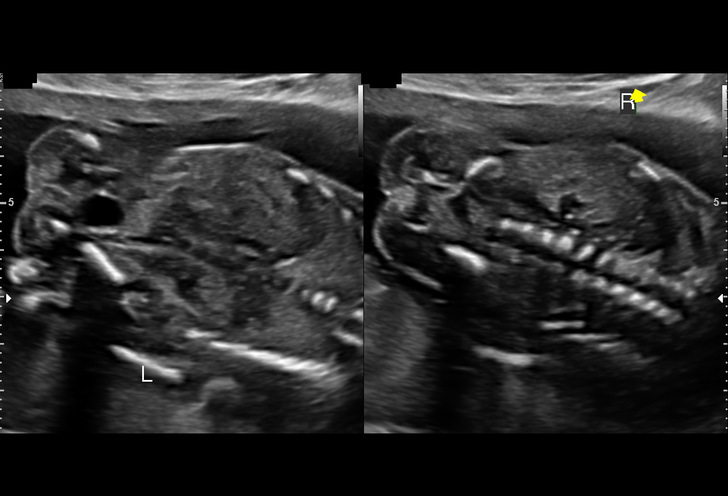
[im 29/87]
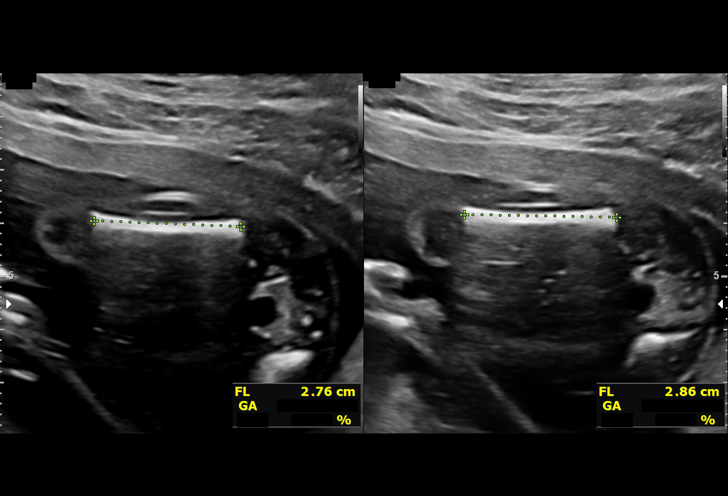
[im 36/87]
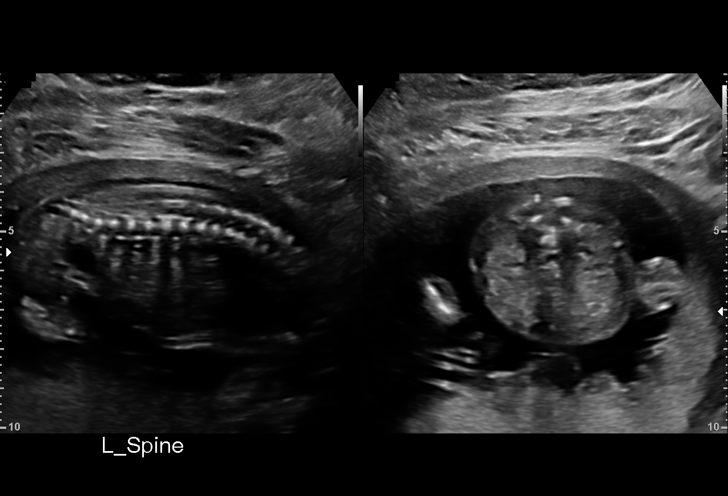
[im 45/87]
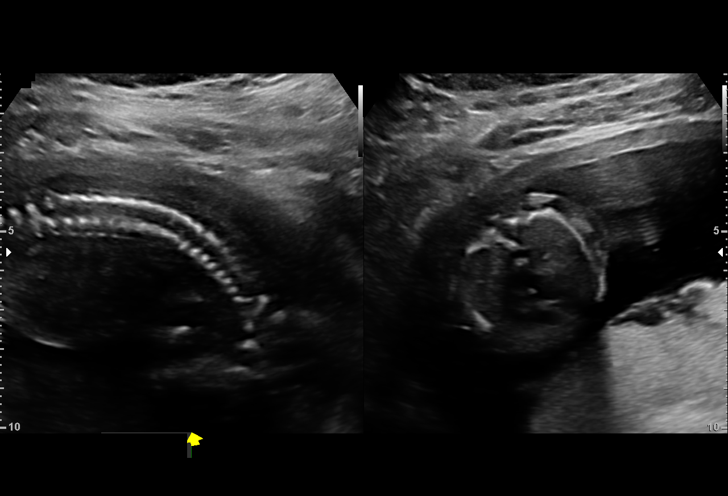
[im 51/87]
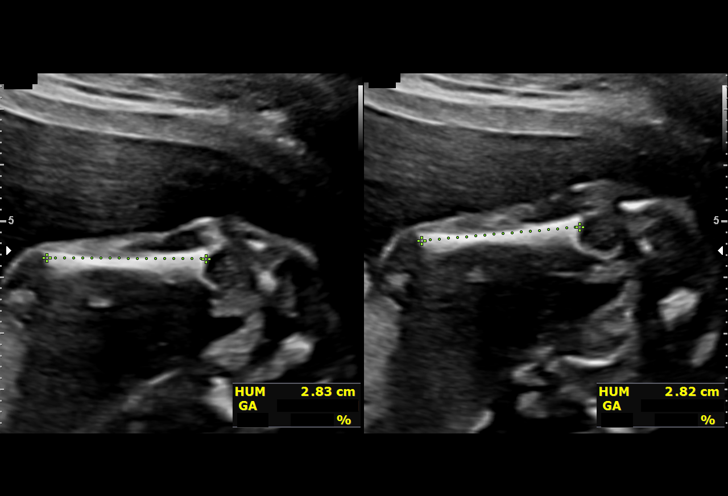
[im 58/87]
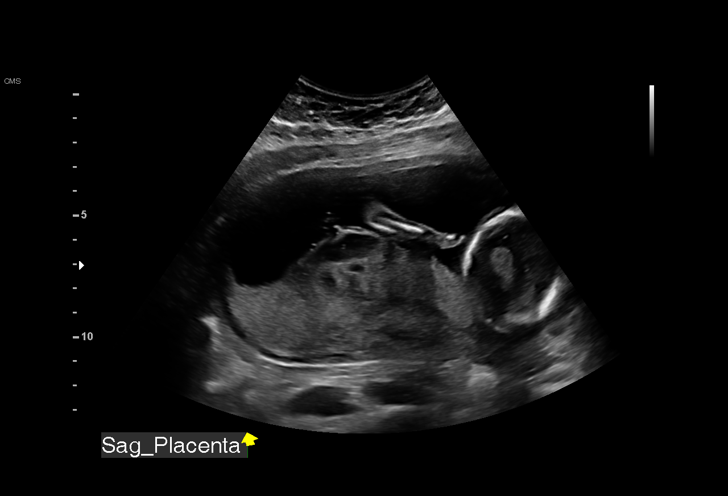
[im 64/87]
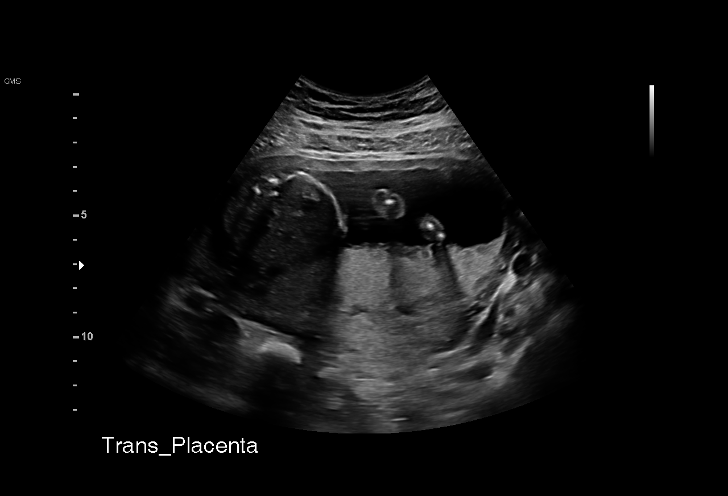
[im 71/87]
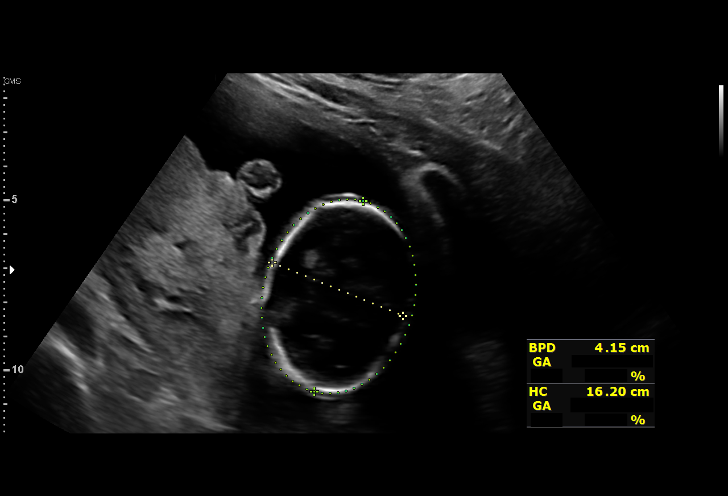
[im 77/87]
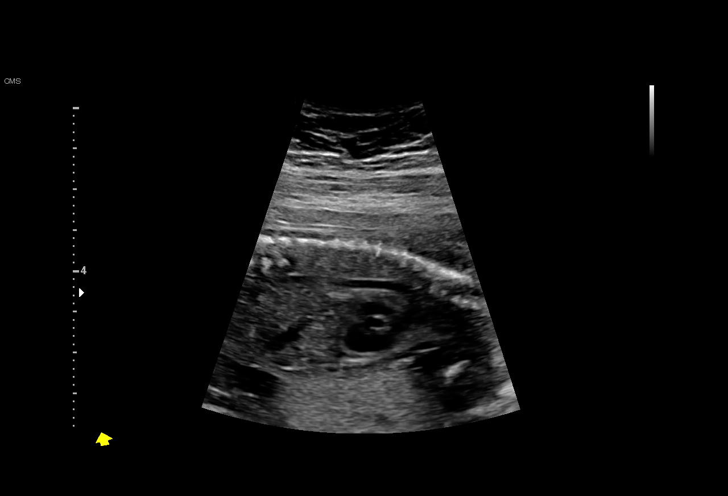
[im 83/87]
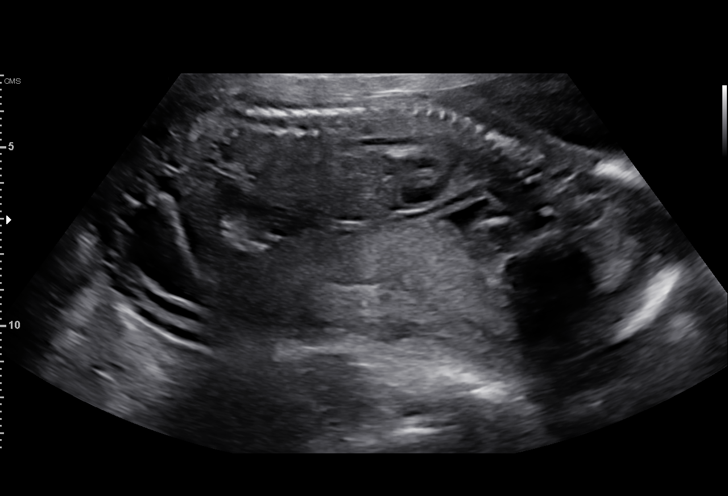

[13 of 28 positions shown; findings below may reference images not displayed]

----------------------------------------------------------------------

 ----------------------------------------------------------------------
Indications

  Obesity complicating pregnancy, second
  trimester (pregravid BMI 32)
  Antenatal screening for malformations (low
  risk NIPS, neg [place])
  19 weeks gestation of pregnancy
 ----------------------------------------------------------------------
Fetal Evaluation

 Num Of Fetuses:         1
 Fetal Heart Rate(bpm):  137
 Cardiac Activity:       Observed
 Presentation:           Cephalic
 Placenta:               Posterior
 P. Cord Insertion:      Visualized

 Amniotic Fluid
 AFI FV:      Within normal limits

                             Largest Pocket(cm)

Biometry

 BPD:      44.2  mm     G. Age:  19w 3d         67  %    CI:        72.43   %    70 - 86
                                                         FL/HC:      17.0   %    16.1 -
 HC:      165.2  mm     G. Age:  19w 2d         54  %    HC/AC:      1.20        1.09 -
 AC:      137.3  mm     G. Age:  19w 1d         51  %    FL/BPD:     63.6   %
 FL:       28.1  mm     G. Age:  18w 4d         29  %    FL/AC:      20.5   %    20 - 24
 HUM:      28.4  mm     G. Age:  19w 1d         55  %

 Est. FW:     266  gm      0 lb 9 oz     42  %
OB History
 Gravidity:    1         Term:   0        Prem:   0        SAB:   0
 TOP:          0       Ectopic:  0        Living: 0
Gestational Age

 LMP:           19w 0d        Date:  04/13/19                 EDD:   01/18/20
 U/S Today:     19w 1d                                        EDD:   01/17/20
 Best:          19w 0d     Det. By:  LMP  (04/13/19)          EDD:   01/18/20
Anatomy

 Cranium:               Appears normal         LVOT:                   Not well visualized
 Cavum:                 Not well visualized    Aortic Arch:            Appears normal
 Ventricles:            Not well visualized    Ductal Arch:            Appears normal
 Choroid Plexus:        Appears normal         Diaphragm:              Appears normal
 Cerebellum:            Appears normal         Stomach:                Appears normal, left
                                                                       sided
 Posterior Fossa:       Appears normal         Abdomen:                Appears normal
 Nuchal Fold:           Not well visualized    Abdominal Wall:         Appears nml (cord
                                                                       insert, abd wall)
 Face:                  Appears normal         Cord Vessels:           Appears normal (3
                        (orbits and profile)                           vessel cord)
 Lips:                  Appears normal         Kidneys:                Appear normal
 Palate:                Appears normal         Bladder:                Appears normal
 Thoracic:              Appears normal         Spine:                  Appears normal
 Heart:                 Not well visualized    Upper Extremities:      Appears normal
 RVOT:                  Not well visualized    Lower Extremities:      Appears normal

 Other:  Heels/feet visualized. Nasal bone visualized. Technically difficult due
         to maternal habitus and fetal position.
Cervix Uterus Adnexa

 Cervix
 Length:            3.3  cm.
 Normal appearance by transabdominal scan.

 Uterus
 No abnormality visualized.

 Left Ovary
 Not visualized.

 Right Ovary
 Not visualized.

 Cul De Sac
 No free fluid seen.

 Adnexa
 No abnormality visualized.
Comments

 This patient was seen for a detailed fetal anatomy scan due
 to maternal obesity. She denies any significant past medical
 history and denies any problems in her current pregnancy.
 She had a cell free DNA test earlier in her pregnancy which
 indicated a low risk for trisomy 21, 18, and 13. A male fetus is
 predicted.
 She was informed that the fetal growth and amniotic fluid
 level were appropriate for her gestational age.
 There were no obvious fetal anomalies noted on today's
 ultrasound exam.  The views of the fetal anatomy were
 limited today due to the fetal position.
 The patient was informed that anomalies may be missed due
 to technical limitations. If the fetus is in a suboptimal position
 or maternal habitus is increased, visualization of the fetus in
 the maternal uterus may be impaired.
 A follow-up exam was scheduled in 4 weeks to obtain better
 views of the fetal anatomy.

## 2020-12-11 IMAGING — CT CT CERVICAL SPINE W/O CM
3 of 4 series · 12 of 35 positions shown, 14 images · non-contrast
Comparison: None.

CLINICAL DATA: Status post trauma.

EXAM:
CT CERVICAL SPINE WITHOUT CONTRAST
TECHNIQUE: Multidetector CT imaging of the cervical spine was performed without
intravenous contrast. Multiplanar CT image reconstructions were also
generated.

[Series 4: c_spine 2.0 st · axial · 0.30mm/px · z∈[-188,-66]mm · 4 of 93 slices shown, 5 images]
[im 16/93  soft-tissue]
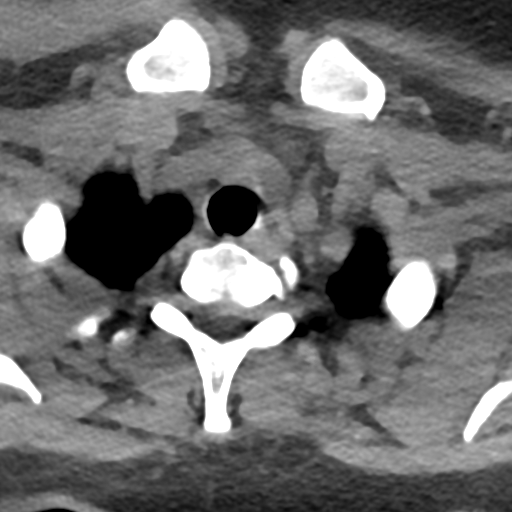
[im 16/93  bone]
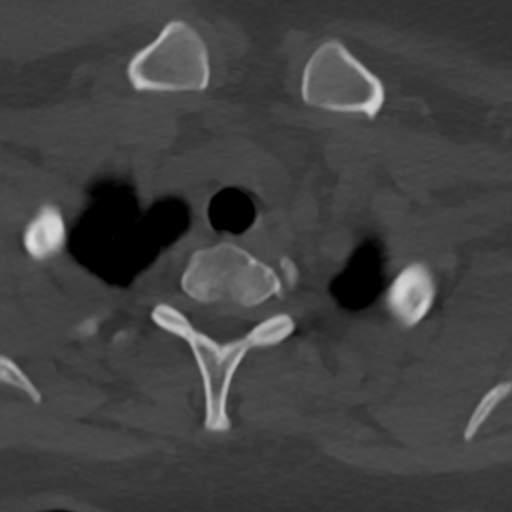
[im 31/93  bone]
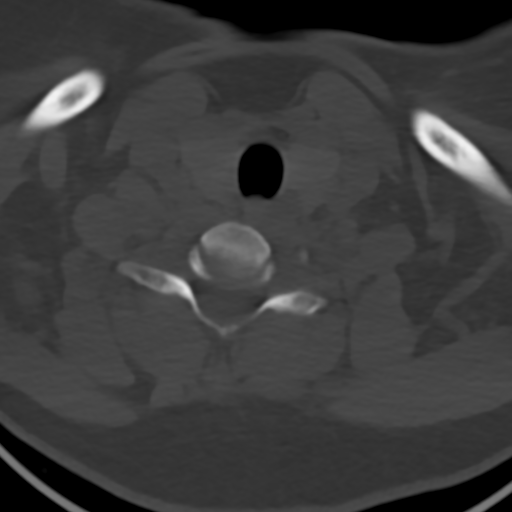
[im 62/93  bone]
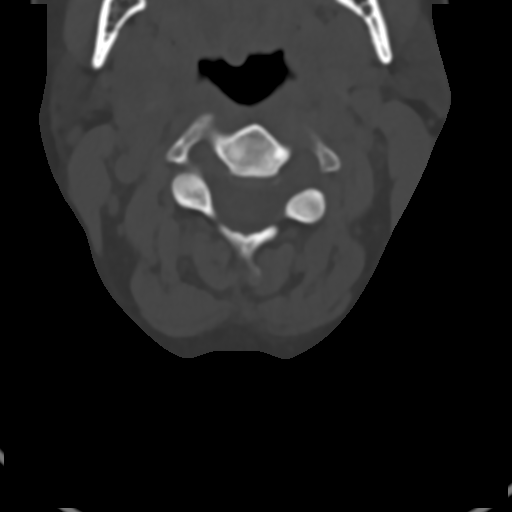
[im 77/93  bone]
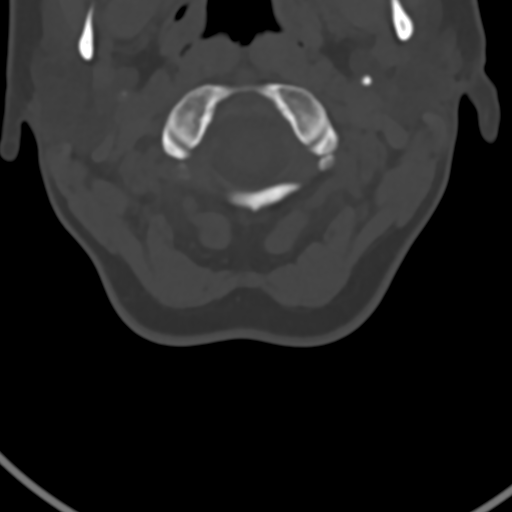

[Series 6: c_spine 2.0 sag bone · sagittal · 0.24mm/px · 5 of 61 slices shown, 6 images]
[im 21/61  bone]
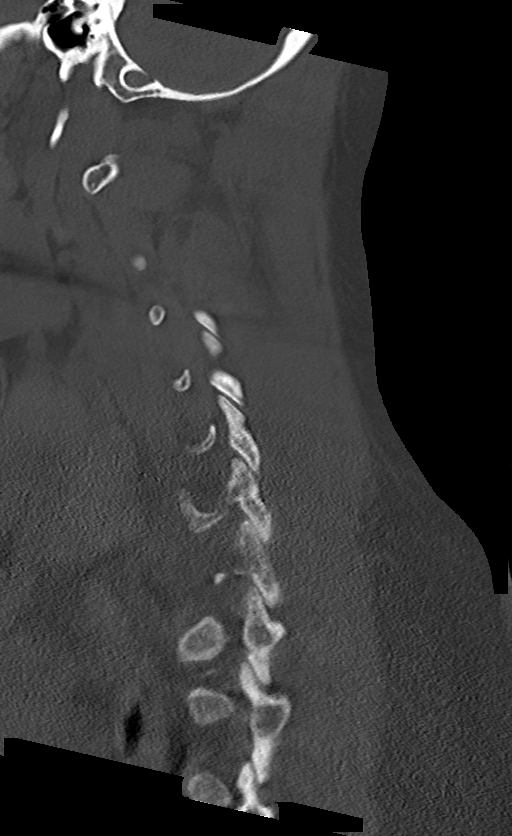
[im 26/61  bone]
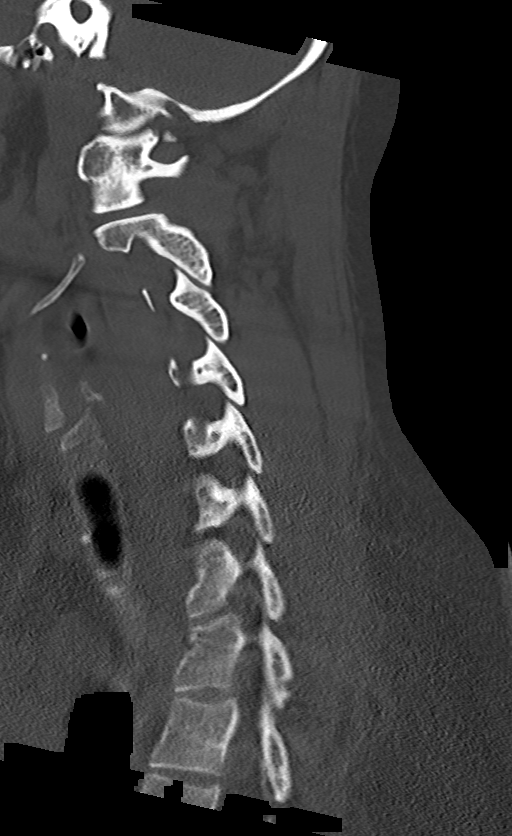
[im 31/61  soft-tissue]
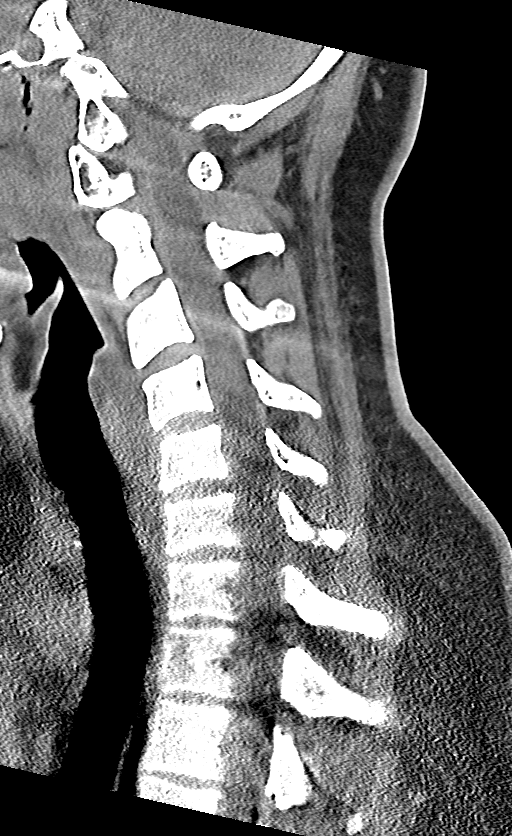
[im 31/61  bone]
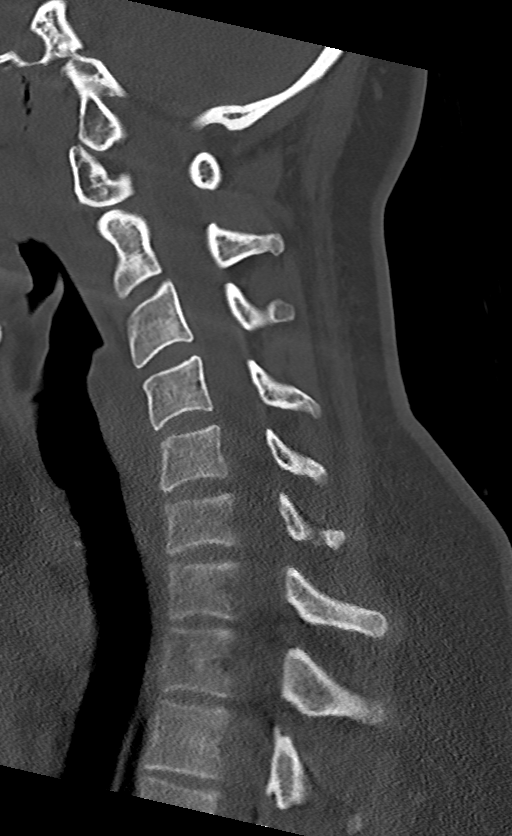
[im 36/61  bone]
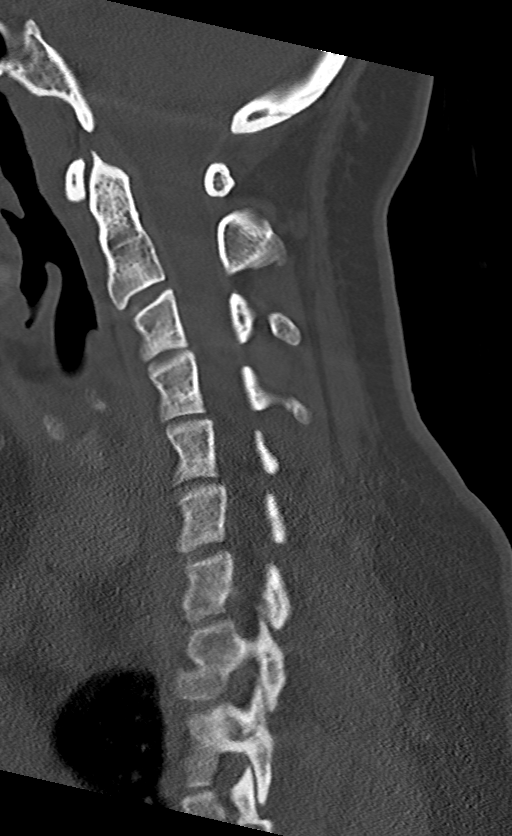
[im 41/61  bone]
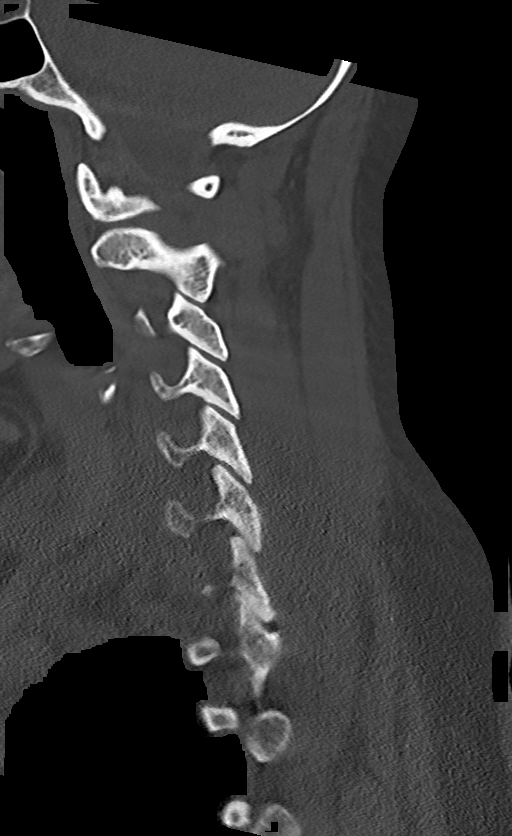

[Series 7: c_spine 2.0 cor bone · coronal · 0.26mm/px · 3 of 61 slices shown]
[im 13/61  bone]
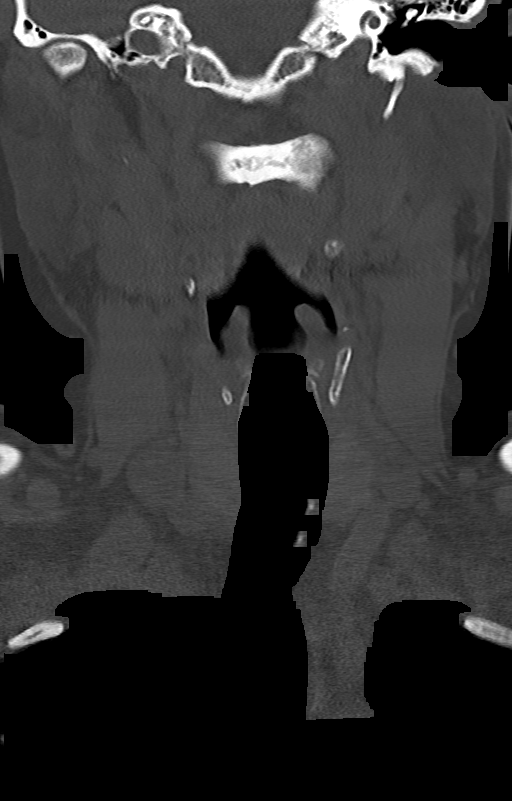
[im 25/61  bone]
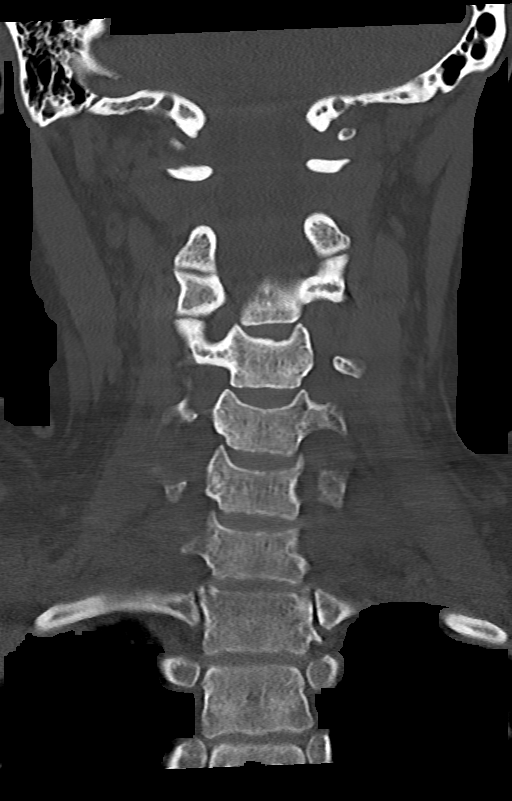
[im 37/61  bone]
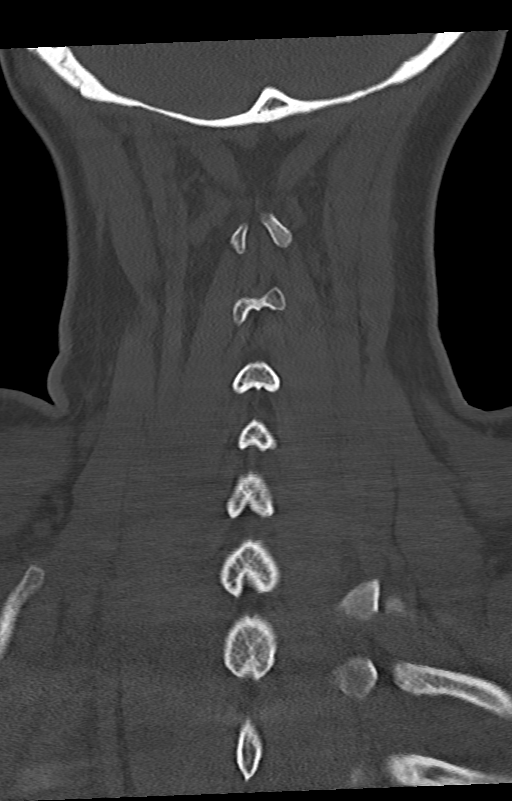

[12 of 35 positions shown; findings below may reference images not displayed]

FINDINGS: Alignment: Normal.

Skull base and vertebrae: No acute fracture. No primary bone lesion
or focal pathologic process.

Soft tissues and spinal canal: No prevertebral fluid or swelling. No
visible canal hematoma.

Disc levels:

C2-3: Normal endplates. Normal disc height and morphology. Normal
bilateral uncovertebral and apophyseal joints. Normal central canal
and intervertebral neuroforamina.
C3-4: Normal endplates. Normal disc height and morphology. Normal
bilateral uncovertebral and apophyseal joints. Normal central canal
and intervertebral neuroforamina.
C4-5: Normal endplates. Normal disc height and morphology. Normal
bilateral uncovertebral and apophyseal joints. Normal central canal
and intervertebral neuroforamina.
C5-6: Normal endplates. Normal disc height and morphology. Normal
bilateral uncovertebral and apophyseal joints. Normal central canal
and intervertebral neuroforamina.
C6-7: Normal endplates. Normal disc height and morphology. Normal
bilateral uncovertebral and apophyseal joints. Normal central canal
and intervertebral neuroforamina.
C7-T1: Normal endplates. Normal disc height and morphology. Normal
bilateral uncovertebral and apophyseal joints. Normal central canal
and intervertebral neuroforamina.

Upper chest: Negative.

Other: None.
IMPRESSION: Normal CT of the cervical spine.

## 2021-05-07 ENCOUNTER — Other Ambulatory Visit (HOSPITAL_COMMUNITY)
Admission: RE | Admit: 2021-05-07 | Discharge: 2021-05-07 | Disposition: A | Discharge status: Home / Self Care | Payer: BC Managed Care – PPO | Source: Ambulatory Visit | Attending: Obstetrics and Gynecology | Admitting: Obstetrics and Gynecology

## 2021-05-07 ENCOUNTER — Other Ambulatory Visit: Payer: Self-pay

## 2021-05-07 ENCOUNTER — Encounter: Payer: Self-pay | Admitting: Obstetrics and Gynecology

## 2021-05-07 ENCOUNTER — Ambulatory Visit (INDEPENDENT_AMBULATORY_CARE_PROVIDER_SITE_OTHER): Payer: BC Managed Care – PPO | Admitting: Obstetrics and Gynecology

## 2021-05-07 DIAGNOSIS — Z3480 Encounter for supervision of other normal pregnancy, unspecified trimester: Secondary | ICD-10-CM | POA: Insufficient documentation

## 2021-05-07 DIAGNOSIS — Z3A10 10 weeks gestation of pregnancy: Secondary | ICD-10-CM

## 2021-05-07 NOTE — Progress Notes (Signed)
DATING AND VIABILITY SONOGRAM   Rebekah Hicks is a 35 y.o. year old G2P1001 with LMP Patient's last menstrual period was 02/22/2021. which would correlate to  [redacted]w[redacted]d weeks gestation.  She has regular menstrual cycles.   She is here today for a confirmatory initial sonogram.    GESTATION: SINGLETON yes     FETAL ACTIVITY:          Heart rate         174          The fetus is active.          ADNEXA: The ovaries are normal. With a Rt CL noted   GESTATIONAL AGE AND  BIOMETRICS:  Gestational criteria: Estimated Date of Delivery: 11/29/21 by LMP now at [redacted]w[redacted]d  Previous Scans:0      CROWN RUMP LENGTH           36.66 mm         10 weeks 4d                                                                               AVERAGE EGA(BY THIS SCAN):  [redacted]w[redacted]d   WORKING EDD( LMP ):  11/29/21     TECHNICIAN COMMENTS:     A copy of this report including all images has been saved and backed up to a second source for retrieval if needed. All measures and details of the anatomical scan, placentation, fluid volume and pelvic anatomy are contained in that report.  Granville Lewis 05/07/2021 1:58 PM

## 2021-05-07 NOTE — Progress Notes (Signed)
History:   Rebekah Hicks is a 35 y.o. G2P1001 at [redacted]w[redacted]d by LMP being seen today for her first obstetrical visit.  Her obstetrical history is significant for obesity and migraine HA's . Patient is unsure about breastfeeding.  Pregnancy history fully reviewed. Partner is involved and present today.  Patient reports no complaints.  HISTORY: OB History  Gravida Para Term Preterm AB Living  2 1 1  0 0 1  SAB IAB Ectopic Multiple Live Births  0 0 0 0 1    # Outcome Date GA Lbr Len/2nd Weight Sex Delivery Anes PTL Lv  2 Current           1 Term 01/14/20 [redacted]w[redacted]d 05:40 / 04:54 7 lb 6.3 oz (3.355 kg) M Vag-Spont EPI  LIV     Name: Kersting,BOY Marshal     Apgar1: 8  Apgar5: 9    Last pap smear was done 2018 and was normal  Past Medical History:  Diagnosis Date   Migraine    Obesity during pregnancy, antepartum 06/28/2019   Recommendations [x]  Aspirin 81 mg daily after 12 weeks; discontinue 6 weeks postpartum  [ ]  Nutrition consult [ ]  Weight gain 11-20 lbs for singleton and 25-35 lbs for twin pregnancy (IOM guidelines) Higher class of obesity patients recommended to gain closer to lower limit  Weight loss is associated with adverse outcomes [ ]  Baseline and surveillance labs (pulled in from , refresh links as ne   Supervision of normal pregnancy 06/28/2019    Nursing Staff Provider Office Location  [place] Dating  LMP Language  English Anatomy   f/u in 4 weeks to complete anatomy  Flu Vaccine  07/06/19 Genetic Screen  NIPS: Low Risk female AFP:     TDaP vaccine   10/28/19 Hgb A1C or  GTT Early A1C 5 Third trimester normal Rhogam  N/A   LAB RESULTS  Feeding Plan Breast Blood Type A/RH(D) POSITIVE/-- (09/15 1338)  Contraception List given 2/12 Antibody NO   Traumatic injury during pregnancy in second trimester 10/19/2019   Vaginal Pap smear, abnormal    Past Surgical History:  Procedure Laterality Date   NO PAST SURGERIES     Family History  Problem Relation Age of Onset   Heart attack  Father    Hypertension Father    Hyperlipidemia Father    Diabetes Father    Hyperlipidemia Mother    Hypertension Mother    Diabetes Other        grandparent   Diabetes Maternal Grandmother    Alzheimer's disease Maternal Grandfather    Heart attack Paternal Grandmother    Stroke Paternal Grandmother    Social History   Tobacco Use   Smoking status: Never   Smokeless tobacco: Never  Vaping Use   Vaping Use: Never used  Substance Use Topics   Alcohol use: Not Currently    Alcohol/week: 1.0 standard drink    Types: 1 Standard drinks or equivalent per week   Drug use: No   No Known Allergies Current Outpatient Medications on File Prior to Visit  Medication Sig Dispense Refill   Prenatal Vit-Fe Fumarate-FA (PRENATAL MULTIVITAMIN) TABS tablet Take 1 tablet by mouth daily at 12 noon.     No current facility-administered medications on file prior to visit.    Review of Systems Pertinent items noted in HPI and remainder of comprehensive ROS otherwise negative.  Physical Exam:   Vitals:   05/07/21 1330  BP: 107/71  Pulse: 67  Weight: 211  lb (95.7 kg)   Fetal Heart Rate (bpm): 174Bedside Ultrasound for FHR check: Viable intrauterine pregnancy with positive cardiac activity noted, fetal heart rate  Patient informed that the ultrasound is considered a limited obstetric ultrasound and is not intended to be a complete ultrasound exam.  Patient also informed that the ultrasound is not being completed with the intent of assessing for fetal or placental anomalies or any pelvic abnormalities.  Explained that the purpose of today's ultrasound is to assess for fetal heart rate.  Patient acknowledges the purpose of the exam and the limitations of the study. General: well-developed, well-nourished female in no acute distress  Breasts:  normal appearance, no masses or tenderness bilaterally  Skin: normal coloration and turgor, no rashes  Neurologic: oriented, normal, negative, normal mood   Extremities: normal strength, tone, and muscle mass, ROM of all joints is normal  HEENT PERRLA, extraocular movement intact and sclera clear, anicteric  Neck supple and no masses  Cardiovascular: regular rate and rhythm  Respiratory:  no respiratory distress, normal breath sounds  Abdomen: soft, non-tender; bowel sounds normal; no masses,  no organomegaly  Pelvic: normal external genitalia, no lesions, normal vaginal mucosa, normal vaginal discharge, normal cervix, pap smear done. 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 Supervision of other normal pregnancy, antepartum  - Cytology - PAP( [place]) - Culture, OB Urine - HIV antibody (with reflex) - Obstetric panel - Hepatitis C Antibody - Babyscripts Schedule Optimization - [place] bedside; Future  Patient to return @ 12 weeks to have labs drawn.  Initial labs drawn. Patient welcomed to practice.  Continue prenatal vitamins. Problem list reviewed and updated. Genetic Screening discussed, NIPS: requested. Ultrasound discussed; fetal anatomic survey: requested. Anticipatory guidance about prenatal visits given including labs, ultrasounds, and testing. Discussed usage of Babyscripts and virtual visits as additional source of managing and completing prenatal visits in midst of coronavirus and pandemic.   Encouraged to complete MyChart Registration for her ability to review results, send requests, and have questions addressed.  The nature of [place] [facility] with multiple MDs and Advanced Practice Providers was explained to patient; also emphasized that residents, students are part of our team. Routine obstetric precautions reviewed. Encouraged to seek out care  at office or emergency room [facility] preferred) for urgent and/or emergent concerns. No follow-ups on file.    Codie Krogh, Harolyn Rutherford, NP  Faculty Practice [facility] [employer], [facility]

## 2021-05-08 LAB — OBSTETRIC PANEL
Absolute Monocytes: 443 cells/uL (ref 200–950)
Antibody Screen: NOT DETECTED
Basophils Absolute: 54 cells/uL (ref 0–200)
Basophils Relative: 0.5 %
Eosinophils Absolute: 205 cells/uL (ref 15–500)
Eosinophils Relative: 1.9 %
HCT: 40.5 % (ref 35.0–45.0)
Hemoglobin: 13.5 g/dL (ref 11.7–15.5)
Hepatitis B Surface Ag: NONREACTIVE
Lymphs Abs: 2711 cells/uL (ref 850–3900)
MCH: 29.9 pg (ref 27.0–33.0)
MCHC: 33.3 g/dL (ref 32.0–36.0)
MCV: 89.6 fL (ref 80.0–100.0)
MPV: 11.3 fL (ref 7.5–12.5)
Monocytes Relative: 4.1 %
Neutro Abs: 7387 cells/uL (ref 1500–7800)
Neutrophils Relative %: 68.4 %
Platelets: 269 10*3/uL (ref 140–400)
RBC: 4.52 10*6/uL (ref 3.80–5.10)
RDW: 12.8 % (ref 11.0–15.0)
RPR Ser Ql: NONREACTIVE
Rubella: 2.68 Index
Total Lymphocyte: 25.1 %
WBC: 10.8 10*3/uL (ref 3.8–10.8)

## 2021-05-08 LAB — HIV ANTIBODY (ROUTINE TESTING W REFLEX): HIV 1&2 Ab, 4th Generation: NONREACTIVE

## 2021-05-08 LAB — HEPATITIS C ANTIBODY
Hepatitis C Ab: NONREACTIVE
SIGNAL TO CUT-OFF: 0.01 (ref <1.00)

## 2021-05-09 LAB — CYTOLOGY - PAP
Adequacy: ABSENT
Chlamydia: NEGATIVE
Comment: NEGATIVE
Comment: NEGATIVE
Comment: NORMAL
Diagnosis: NEGATIVE
High risk HPV: NEGATIVE
Neisseria Gonorrhea: NEGATIVE

## 2021-05-09 LAB — URINE CULTURE, OB REFLEX

## 2021-05-09 LAB — CULTURE, OB URINE

## 2021-05-22 ENCOUNTER — Ambulatory Visit (INDEPENDENT_AMBULATORY_CARE_PROVIDER_SITE_OTHER): Payer: BC Managed Care – PPO | Admitting: *Deleted

## 2021-05-22 ENCOUNTER — Other Ambulatory Visit: Payer: Self-pay

## 2021-05-22 DIAGNOSIS — Z3480 Encounter for supervision of other normal pregnancy, unspecified trimester: Secondary | ICD-10-CM | POA: Diagnosis not present

## 2021-05-22 NOTE — Progress Notes (Signed)
Pt here for Panorama only blood draw.

## 2021-06-03 ENCOUNTER — Telehealth: Payer: Self-pay

## 2021-06-03 DIAGNOSIS — Z3480 Encounter for supervision of other normal pregnancy, unspecified trimester: Secondary | ICD-10-CM

## 2021-06-03 DIAGNOSIS — O285 Abnormal chromosomal and genetic finding on antenatal screening of mother: Secondary | ICD-10-CM

## 2021-06-03 NOTE — Telephone Encounter (Signed)
Spoke with pt and she is aware of Panorama results that show High Risk for Monosomy X. Per Dr.Leggett I let pt know that sometimes there are false positives. Pt expressed understanding. Per Dr.Leggett referral sent to genetic counseling and pt is aware they will reach out to her to schedule an appointment. Pt expressed understanding.

## 2021-06-04 ENCOUNTER — Ambulatory Visit (INDEPENDENT_AMBULATORY_CARE_PROVIDER_SITE_OTHER): Payer: BC Managed Care – PPO | Admitting: Advanced Practice Midwife

## 2021-06-04 ENCOUNTER — Other Ambulatory Visit: Payer: Self-pay

## 2021-06-04 VITALS — BP 115/74 | HR 81 | Wt 211.0 lb

## 2021-06-04 DIAGNOSIS — Z3A14 14 weeks gestation of pregnancy: Secondary | ICD-10-CM

## 2021-06-04 DIAGNOSIS — O285 Abnormal chromosomal and genetic finding on antenatal screening of mother: Secondary | ICD-10-CM

## 2021-06-04 DIAGNOSIS — Z3480 Encounter for supervision of other normal pregnancy, unspecified trimester: Secondary | ICD-10-CM

## 2021-06-04 NOTE — Progress Notes (Signed)
   PRENATAL VISIT NOTE  Subjective:  Rebekah Hicks is a 35 y.o. G2P1001 at [redacted]w[redacted]d being seen today for ongoing prenatal care.  She is currently monitored for the following issues for this low-risk pregnancy and has Hyperlipidemia, mixed; Dysplasia of cervix, high grade CIN 2; and Supervision of other normal pregnancy, antepartum on their problem list.  Patient reports no complaints.  Contractions: Not present. Vag. Bleeding: None.  Movement: Absent. Denies leaking of fluid.   The following portions of the patient's history were reviewed and updated as appropriate: allergies, current medications, past family history, past medical history, past social history, past surgical history and problem list.   Objective:   Vitals:   06/04/21 1508  BP: 115/74  Pulse: 81  Weight: 211 lb (95.7 kg)    Fetal Status: Fetal Heart Rate (bpm): 157   Movement: Absent     General:  Alert, oriented and cooperative. Patient is in no acute distress.  Skin: Skin is warm and dry. No rash noted.   Cardiovascular: Normal heart rate noted  Respiratory: Normal respiratory effort, no problems with respiration noted  Abdomen: Soft, gravid, appropriate for gestational age.  Pain/Pressure: Absent     Pelvic: Cervical exam deferred        Extremities: Normal range of motion.  Edema: None  Mental Status: Normal mood and affect. Normal behavior. Normal judgment and thought content.   Assessment and Plan:  Pregnancy: G2P1001 at [redacted]w[redacted]d 1. Supervision of other normal pregnancy, antepartum --Anticipatory guidance about next visits/weeks of pregnancy given. --Next appt in 4 weeks  2. Abnormal genetic test during pregnancy --NIPS with results high risk for monosomy X --Reviewed results with pt today, questions answered --Consult Dr Judeth Cornfield with MFM --Genetic counseling scheduled with [facility] US same day at 16 weeks for possible amniocentesis if needed/desired --Warning signs/reasons to seek care reviewed with  pt  3. [redacted] weeks gestation of pregnancy   Preterm labor symptoms and general obstetric precautions including but not limited to vaginal bleeding, contractions, leaking of fluid and fetal movement were reviewed in detail with the patient. Please refer to After Visit Summary for other counseling recommendations.   Return in about 4 weeks (around 07/02/2021).  Future Appointments  Date Time Provider Department Center  06/18/2021  1:15 PM [facility] NURSE [facility]  06/18/2021  1:30 PM [facility] US3 [facility]  06/18/2021  3:00 PM [facility] GENETIC COUNSELING RM [facility]  07/02/2021 10:10 AM Rolm Bookbinder, CNM [facility]    Sharen Counter, CNM

## 2021-06-18 ENCOUNTER — Other Ambulatory Visit: Payer: Self-pay

## 2021-06-18 ENCOUNTER — Ambulatory Visit: Payer: BC Managed Care – PPO | Admitting: *Deleted

## 2021-06-18 ENCOUNTER — Ambulatory Visit: Payer: BC Managed Care – PPO | Attending: Advanced Practice Midwife

## 2021-06-18 ENCOUNTER — Other Ambulatory Visit: Payer: Self-pay | Admitting: *Deleted

## 2021-06-18 ENCOUNTER — Ambulatory Visit (HOSPITAL_BASED_OUTPATIENT_CLINIC_OR_DEPARTMENT_OTHER): Payer: BC Managed Care – PPO

## 2021-06-18 ENCOUNTER — Encounter: Payer: Self-pay | Admitting: *Deleted

## 2021-06-18 VITALS — BP 114/74 | HR 81

## 2021-06-18 DIAGNOSIS — O281 Abnormal biochemical finding on antenatal screening of mother: Secondary | ICD-10-CM | POA: Diagnosis not present

## 2021-06-18 DIAGNOSIS — Z3480 Encounter for supervision of other normal pregnancy, unspecified trimester: Secondary | ICD-10-CM

## 2021-06-18 DIAGNOSIS — Z3A16 16 weeks gestation of pregnancy: Secondary | ICD-10-CM

## 2021-06-18 DIAGNOSIS — O358XX0 Maternal care for other (suspected) fetal abnormality and damage, not applicable or unspecified: Secondary | ICD-10-CM | POA: Insufficient documentation

## 2021-06-18 DIAGNOSIS — O285 Abnormal chromosomal and genetic finding on antenatal screening of mother: Secondary | ICD-10-CM | POA: Insufficient documentation

## 2021-06-18 DIAGNOSIS — O280 Abnormal hematological finding on antenatal screening of mother: Secondary | ICD-10-CM

## 2021-06-18 DIAGNOSIS — O99212 Obesity complicating pregnancy, second trimester: Secondary | ICD-10-CM

## 2021-06-18 DIAGNOSIS — E669 Obesity, unspecified: Secondary | ICD-10-CM | POA: Diagnosis not present

## 2021-06-20 NOTE — Progress Notes (Signed)
Referring physician:  Dr. Ulyess Mort of consultaton:  45 minutes  Rebekah Hicks was referred for genetic counseling at [facility] to review results from noninvasive prenatal screening (NIPS) that came back positive for an increased risk of monosomy X, or Turner syndrome, in the current pregnancy. The ultrasound at the time of this visit also revealed fetal hydrops. The patient was present at the visit with a friend.    We first reviewed general information about chromosomes and Turner syndrome.  Monosomy X (45,X) typically results from the loss of one X chromosome in affected females. This most often occurs due to a random error in chromosomal division during the formation of reproductive cells in a process called nondisjunction. Rarely, Turner syndrome may be caused by a partial deletion of an X chromosome, which can be inherited from a parent. Since Turner syndrome is most often not inherited, recurrence is rare; however, cases of recurrence have been reported. The risk for Turner syndrome does not increase with maternal age unlike some other chromosomal aneuploidies.  Fetuses with Turner syndrome may present prenatally with increased nuchal translucency, cystic hygroma, hydrops, cardiac defects, and renal anomalies on ultrasound. Additionally, there is up to a 99% risk for miscarriage in pregnancies affected with Turner syndrome. The risk of miscarriage is highest in the first trimester. Females born with Turner syndrome typically have short stature and early ovarian failure resulting in infertility. Affected individuals may also present with webbed necks, broad chests, cardiac defects, and renal abnormalities. Affected females typically have normal intelligence, but may have developmental delays, nonverbal learning disabilities, and behavioral problems. Symptoms vary widely among affected individuals.   We reviewed that NIPS analyzes cell free DNA originating from the placenta that is  found in the maternal blood circulation during pregnancy. This test can provide information regarding the presence or absence of extra fetal DNA for chromosomes 13, 18 and 21 as well as the sex chromosomes. The reported detection rate is greater than 99% for trisomy 21, greater than 98% for trisomy 18, greater than 99% for trisomy 13, and greater than 94% for monosomy X. However, this testing cannot be considered diagnostic for chromosome conditions. Positive predictive value (PPV) is the probability that a pregnancy with a positive test result is truly affected. The PPV reported by the NIPS laboratory [facility]) for monosomy X in the current pregnancy was estimated to be 78%. The PPV calculator offered by the [employer] and the [employer] estimated PPV for monosomy X in the current pregnancy to be 50%. Thus, there is a 50-78% chance that this could be a true positive result. The finding of hydrops on ultrasound would further increase the suspicion for this condition.   An ultrasound was performed today prior to our visit. At this early gestational age, 16weeks, 4 days, it is difficult to assess all of the fetal anatomy in detail.  The ultrasound today revealed a large septated cystic hygroma, pleural effusions and marked skin edema.  We discussed that babies with this combination of excess fluid in the body are said to have a condition called hydrops.  Hydrops can happen for many reasons including a chromosome condition, structural heart defect, genetic syndrome, fetal anemia, infections and other causes.  The excess fluid may remain the same, worsen or may resolve as the pregnancy progresses.  The presence of hydrops also increases the concern for fetal demise during the pregnancy. See that report for further details.     Rebekah Hicks was  counseled that there are several possible explanations for her high risk NIPS result. First, the fetus could truly be affected by  Turner syndrome. Second, the fetus could be mosaic for Turner syndrome. Mosaicism occurs when an individual has two or more genetically different sets of cells in their body. Up to 50% of cases of Turner syndrome are mosaic. Individuals who are mosaic for Turner syndrome have some cells in the body with monosomy X and may have other cells that are chromosomally normal with two X chromosomes. We discussed that it would not be possible to tell which features an individual with mosaic Turner syndrome may have, as it would be impossible to assess which specific cells and tissues in the body have Turner syndrome. However, mosaic Turner syndrome has been associated with better prognosis and mosaic fetuses generally have fewer ultrasound findings. Third, the placenta could have Turner syndrome while the fetus could be unaffected. This is a phenomenon known as confined placental mosaicism (CPM). Fourth, Rebekah Hicks may have an X chromosome abnormality herself that could explain this result. Somatic loss of a single X chromosome is a natural phenomenon that can occur as a woman ages. Additionally, women may be mosaic for Turner syndrome and not know it, as women with mosaicism can still be fertile. Lastly, this could be a false positive result that resulted due to normal variation.    We discussed that 30-50% of individuals with Turner syndrome have a cardiac defect, and that these defects are often possible to identify on ultrasound during pregnancy. [facility] for a follow-up ultrasound to more fully evaluate the cardiac anatomy. Due to the association of hydrops and structural heart defect, we will also refer Rebekah Hicks for a fetal echocardiogram.  Rebekah Hicks was also counseled regarding diagnostic testing via amniocentesis. We discussed the technical aspects of the procedure and quoted up to a 1 in 500 (0.2%) risk for spontaneous pregnancy  loss or other adverse pregnancy outcomes as a result of amniocentesis. Cultured cells from an amniocentesis sample allow for the visualization of a fetal karyotype, which can detect >99% of chromosomal aberrations. Chromosomal microarray can also be performed to identify smaller deletions or duplications of fetal chromosomal material. Rebekah Hicks was informed that diagnostic testing would be the only way to determine if the fetus has Turner syndrome prenatally and aid in determining precise recurrence risks for future pregnancies. Diagnostic testing could also aid in determining the level of mosaicism that is present if the fetus is mosaic for Turner syndrome. Rebekah Hicks was also made aware that she has the option of continuing with standard ultrasounds and pursuing genetic testing postnatally, with the caveat that there is a significant chance of fetal demise if the fetus truly has Turner syndrome. After careful consideration, Rebekah Hicks declined amniocentesis at this time. She understands that amniocentesis is available at any point after 16 weeks of pregnancy and that she may opt to undergo the procedure at a later date should she change her mind.  We also inquired about her family history and pregnancy history, though a detailed pedigree was not obtained today.  The patient reported no history of birth defects, learning delays, recurrent pregnancy loss or known genetic conditions.  In addition, there were no complications or known exposures in the pregnancy that would be expected to increase the risk for birth defects.  Plan of care: Rebekah Hicks declined amniocentesis today. Results of genetic testing  would not change the course of the pregnancy for this family. Return to clinic in 1 week for follow up ultrasound for viability and evaluation of hydrops. Detailed anatomy ultrasound at approximately [redacted] weeks gestation. Fetal echocardiogram after [redacted] weeks gestation.   Cherly Anderson, MS, CGC

## 2021-06-26 ENCOUNTER — Ambulatory Visit: Payer: BC Managed Care – PPO | Attending: Obstetrics and Gynecology

## 2021-06-26 ENCOUNTER — Encounter: Payer: Self-pay | Admitting: *Deleted

## 2021-06-26 ENCOUNTER — Other Ambulatory Visit: Payer: Self-pay

## 2021-06-26 ENCOUNTER — Ambulatory Visit: Payer: BC Managed Care – PPO | Admitting: *Deleted

## 2021-06-26 ENCOUNTER — Other Ambulatory Visit: Payer: Self-pay | Admitting: *Deleted

## 2021-06-26 VITALS — BP 130/76 | HR 78

## 2021-06-26 DIAGNOSIS — Z3480 Encounter for supervision of other normal pregnancy, unspecified trimester: Secondary | ICD-10-CM | POA: Diagnosis present

## 2021-06-26 DIAGNOSIS — Z3A17 17 weeks gestation of pregnancy: Secondary | ICD-10-CM

## 2021-06-26 DIAGNOSIS — O358XX0 Maternal care for other (suspected) fetal abnormality and damage, not applicable or unspecified: Secondary | ICD-10-CM

## 2021-06-26 DIAGNOSIS — Z3689 Encounter for other specified antenatal screening: Secondary | ICD-10-CM

## 2021-06-26 DIAGNOSIS — O280 Abnormal hematological finding on antenatal screening of mother: Secondary | ICD-10-CM | POA: Insufficient documentation

## 2021-06-26 DIAGNOSIS — J90 Pleural effusion, not elsewhere classified: Secondary | ICD-10-CM

## 2021-06-29 DIAGNOSIS — O4402 Placenta previa specified as without hemorrhage, second trimester: Secondary | ICD-10-CM | POA: Insufficient documentation

## 2021-06-29 DIAGNOSIS — O285 Abnormal chromosomal and genetic finding on antenatal screening of mother: Secondary | ICD-10-CM | POA: Insufficient documentation

## 2021-07-02 ENCOUNTER — Ambulatory Visit (INDEPENDENT_AMBULATORY_CARE_PROVIDER_SITE_OTHER): Payer: BC Managed Care – PPO

## 2021-07-02 ENCOUNTER — Other Ambulatory Visit: Payer: Self-pay

## 2021-07-02 VITALS — BP 108/75 | HR 79 | Wt 217.0 lb

## 2021-07-02 DIAGNOSIS — O4402 Placenta previa specified as without hemorrhage, second trimester: Secondary | ICD-10-CM

## 2021-07-02 DIAGNOSIS — O285 Abnormal chromosomal and genetic finding on antenatal screening of mother: Secondary | ICD-10-CM

## 2021-07-02 DIAGNOSIS — Z3A18 18 weeks gestation of pregnancy: Secondary | ICD-10-CM

## 2021-07-02 DIAGNOSIS — Z3480 Encounter for supervision of other normal pregnancy, unspecified trimester: Secondary | ICD-10-CM

## 2021-07-02 DIAGNOSIS — O358XX0 Maternal care for other (suspected) fetal abnormality and damage, not applicable or unspecified: Secondary | ICD-10-CM

## 2021-07-02 NOTE — Progress Notes (Signed)
PRENATAL VISIT NOTE  Subjective:  Rebekah Hicks is a 35 y.o. G2P1001 at [redacted]w[redacted]d being seen today for ongoing prenatal care.  She is currently monitored for the following issues for this high-risk pregnancy and has Hyperlipidemia, mixed; Dysplasia of cervix, high grade CIN 2; Supervision of other normal pregnancy, antepartum; Abnormal chromosomal and genetic finding on antenatal screening mother; and Placenta previa antepartum in second trimester on their problem list.  Patient reports no complaints.  Contractions: Not present. Vag. Bleeding: None.  Movement: Absent. Denies leaking of fluid.   The following portions of the patient's history were reviewed and updated as appropriate: allergies, current medications, past family history, past medical history, past social history, past surgical history and problem list.   Objective:   Vitals:   07/02/21 1006  BP: 108/75  Pulse: 79  Weight: 217 lb (98.4 kg)    Fetal Status: Fetal Heart Rate (bpm): 146   Movement: Absent     General:  Alert, oriented and cooperative. Patient is in no acute distress.  Skin: Skin is warm and dry. No rash noted.   Cardiovascular: Normal heart rate noted  Respiratory: Normal respiratory effort, no problems with respiration noted  Abdomen: Soft, gravid, appropriate for gestational age.  Pain/Pressure: Absent     Pelvic: Cervical exam deferred        Extremities: Normal range of motion.  Edema: None  Mental Status: Normal mood and affect. Normal behavior. Normal judgment and thought content.   Assessment and Plan:  Pregnancy: G2P1001 at [redacted]w[redacted]d 1. Supervision of other pregnancy, antepartum -Patient appropriately tearful. States she is physically doing well without complaint.  -Discussed results and previous discussions with providers. Per MFM, offered to have patient come back in 4 weeks for follow up growth. Patient is ok with 8 weeks but is requesting weekly appointments for [facility] confirmation. Will have  patient come for RN visit weekly and see provider as scheduled.  - Delivery planning discussed at length. Reviewed that if patient is found to have IUFD, we will recommend admission to hospital immediately due to maternal risks. Patient verbalized understanding.  - Encouraged patient to discuss delivery planning with support people. She was given options of surgical management vs IOL if IUFD. Discussed that given late gestation, surgical management may have to be done outside of [facility] and does not leave the option for patient to spend time with baby. Discussed IOL if patient wants to see infant.  - Patient currently planning to carry pregnancy until IUFD or term. Reviewed that [facility] team will honor her wishes for this pregnancy despite poor prognosis and support her in any way that we can.  - Patient desires all genetic testing/autopsy at delivery - Information for perinatal therapist given to patient and encouraged patient to seek appointment. Validated feelings and difficulty of current situation  2. 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 [redacted] weeks gestation of pregnancy   4. Placenta previa in second trimester - Discussed bleeding precautions  5. Multiple congenital anomalies of fetus on prenatal ultrasound -See MFM report  Preterm labor symptoms and general obstetric precautions including but not limited to vaginal bleeding, contractions, leaking of fluid and fetal movement were reviewed in detail with the patient. Please refer to After Visit Summary for other counseling recommendations.   Return in about 1 week (around 07/09/2021).  Future Appointments  Date Time Provider Department Center  07/10/2021  3:30 PM [facility] NURSE [facility]  07/17/2021  3:30 PM [facility] NURSE [facility]  07/23/2021  3:30 PM [facility] NURSE [facility]  08/02/2021  8:50 AM Donette Larry, CNM [facility]  08/20/2021  3:15 PM [facility] NURSE [facility]   08/20/2021  3:30 PM [facility] US3 [facility]    Rolm Bookbinder, CNM 07/02/21 11:30 AM

## 2021-07-09 ENCOUNTER — Other Ambulatory Visit: Payer: Self-pay

## 2021-07-09 ENCOUNTER — Ambulatory Visit (INDEPENDENT_AMBULATORY_CARE_PROVIDER_SITE_OTHER): Payer: BC Managed Care – PPO | Admitting: *Deleted

## 2021-07-09 DIAGNOSIS — O358XX0 Maternal care for other (suspected) fetal abnormality and damage, not applicable or unspecified: Secondary | ICD-10-CM

## 2021-07-09 DIAGNOSIS — Z3A19 19 weeks gestation of pregnancy: Secondary | ICD-10-CM

## 2021-07-09 NOTE — Progress Notes (Signed)
Chart reviewed for nurse visit. Agree with plan of care. Patient to return next week   Rennie Plowman 07/09/2021 4:45 PM

## 2021-07-09 NOTE — Progress Notes (Signed)
Pt is here for Fetal heart tones only.  This is being done weekly due to severe anomalies of fetus.  FHT today are 148 BPM.  Fetus is active.

## 2021-07-10 ENCOUNTER — Ambulatory Visit: Payer: BC Managed Care – PPO

## 2021-07-17 ENCOUNTER — Ambulatory Visit: Payer: BC Managed Care – PPO

## 2021-07-18 ENCOUNTER — Ambulatory Visit: Payer: BC Managed Care – PPO

## 2021-07-22 ENCOUNTER — Other Ambulatory Visit: Payer: Self-pay

## 2021-07-22 ENCOUNTER — Ambulatory Visit (INDEPENDENT_AMBULATORY_CARE_PROVIDER_SITE_OTHER): Payer: BC Managed Care – PPO | Admitting: *Deleted

## 2021-07-22 DIAGNOSIS — Z3A21 21 weeks gestation of pregnancy: Secondary | ICD-10-CM

## 2021-07-22 DIAGNOSIS — O358XX0 Maternal care for other (suspected) fetal abnormality and damage, not applicable or unspecified: Secondary | ICD-10-CM

## 2021-07-22 NOTE — Progress Notes (Signed)
Fetal heart tones seen and heard on U/S today.  FHT 145.  Husband accomanied wife today for visit.

## 2021-07-23 ENCOUNTER — Ambulatory Visit: Payer: BC Managed Care – PPO

## 2021-07-29 ENCOUNTER — Ambulatory Visit: Payer: BC Managed Care – PPO

## 2021-08-02 ENCOUNTER — Inpatient Hospital Stay (HOSPITAL_COMMUNITY)
Admission: AD | Admit: 2021-08-02 | Discharge: 2021-08-04 | DRG: 807 | Disposition: A | Discharge status: Home / Self Care | Payer: BC Managed Care – PPO | Attending: Obstetrics & Gynecology | Admitting: Obstetrics & Gynecology

## 2021-08-02 ENCOUNTER — Inpatient Hospital Stay (HOSPITAL_BASED_OUTPATIENT_CLINIC_OR_DEPARTMENT_OTHER): Payer: BC Managed Care – PPO

## 2021-08-02 ENCOUNTER — Ambulatory Visit (INDEPENDENT_AMBULATORY_CARE_PROVIDER_SITE_OTHER): Payer: BC Managed Care – PPO | Admitting: Certified Nurse Midwife

## 2021-08-02 ENCOUNTER — Other Ambulatory Visit: Payer: Self-pay

## 2021-08-02 ENCOUNTER — Encounter (HOSPITAL_COMMUNITY): Payer: Self-pay | Admitting: Obstetrics and Gynecology

## 2021-08-02 VITALS — BP 108/73 | HR 75 | Wt 221.0 lb

## 2021-08-02 DIAGNOSIS — Z3480 Encounter for supervision of other normal pregnancy, unspecified trimester: Secondary | ICD-10-CM

## 2021-08-02 DIAGNOSIS — Z3A23 23 weeks gestation of pregnancy: Secondary | ICD-10-CM

## 2021-08-02 DIAGNOSIS — O364XX0 Maternal care for intrauterine death, not applicable or unspecified: Secondary | ICD-10-CM | POA: Diagnosis present

## 2021-08-02 DIAGNOSIS — O285 Abnormal chromosomal and genetic finding on antenatal screening of mother: Secondary | ICD-10-CM

## 2021-08-02 DIAGNOSIS — O4402 Placenta previa specified as without hemorrhage, second trimester: Secondary | ICD-10-CM

## 2021-08-02 DIAGNOSIS — O328XX0 Maternal care for other malpresentation of fetus, not applicable or unspecified: Secondary | ICD-10-CM | POA: Diagnosis present

## 2021-08-02 DIAGNOSIS — O99214 Obesity complicating childbirth: Secondary | ICD-10-CM | POA: Diagnosis present

## 2021-08-02 DIAGNOSIS — Z20822 Contact with and (suspected) exposure to covid-19: Secondary | ICD-10-CM | POA: Diagnosis present

## 2021-08-02 LAB — COMPREHENSIVE METABOLIC PANEL
ALT: 10 U/L (ref 0–44)
AST: 18 U/L (ref 15–41)
Albumin: 3.1 g/dL — ABNORMAL LOW (ref 3.5–5.0)
Alkaline Phosphatase: 87 U/L (ref 38–126)
Anion gap: 9 (ref 5–15)
BUN: 7 mg/dL (ref 6–20)
CO2: 20 mmol/L — ABNORMAL LOW (ref 22–32)
Calcium: 9.2 mg/dL (ref 8.9–10.3)
Chloride: 107 mmol/L (ref 98–111)
Creatinine, Ser: 0.58 mg/dL (ref 0.44–1.00)
GFR, Estimated: >60 mL/min (ref >60)
Glucose, Bld: 102 mg/dL — ABNORMAL HIGH (ref 70–99)
Potassium: 3.4 mmol/L — ABNORMAL LOW (ref 3.5–5.1)
Sodium: 136 mmol/L (ref 135–145)
Total Bilirubin: 0.3 mg/dL (ref 0.3–1.2)
Total Protein: 6.9 g/dL (ref 6.5–8.1)

## 2021-08-02 LAB — CBC
HCT: 37 % (ref 36.0–46.0)
Hemoglobin: 12.3 g/dL (ref 12.0–15.0)
MCH: 29.9 pg (ref 26.0–34.0)
MCHC: 33.2 g/dL (ref 30.0–36.0)
MCV: 90 fL (ref 80.0–100.0)
Platelets: 259 10*3/uL (ref 150–400)
RBC: 4.11 MIL/uL (ref 3.87–5.11)
RDW: 13.4 % (ref 11.5–15.5)
WBC: 11.4 10*3/uL — ABNORMAL HIGH (ref 4.0–10.5)
nRBC: 0 % (ref 0.0–0.2)

## 2021-08-02 LAB — DIC (DISSEMINATED INTRAVASCULAR COAGULATION)PANEL
D-Dimer, Quant: 1.55 ug/mL-FEU — ABNORMAL HIGH (ref 0.00–0.50)
Fibrinogen: 615 mg/dL — ABNORMAL HIGH (ref 210–475)
INR: 1 (ref 0.8–1.2)
Platelets: 242 10*3/uL (ref 150–400)
Prothrombin Time: 13.5 seconds (ref 11.4–15.2)
Smear Review: NONE SEEN
aPTT: 29 seconds (ref 24–36)

## 2021-08-02 LAB — RESP PANEL BY RT-PCR (FLU A&B, COVID) ARPGX2
Influenza A by PCR: NEGATIVE
Influenza B by PCR: NEGATIVE
SARS Coronavirus 2 by RT PCR: NEGATIVE

## 2021-08-02 LAB — TYPE AND SCREEN
ABO/RH(D): A POS
Antibody Screen: NEGATIVE

## 2021-08-02 MED ORDER — ACETAMINOPHEN 325 MG PO TABS: 650.0000 mg | ORAL_TABLET | ORAL | Status: DC | PRN

## 2021-08-02 MED ORDER — EPHEDRINE 5 MG/ML INJ: 10.0000 mg | INTRAVENOUS | Status: DC | PRN

## 2021-08-02 MED ORDER — LACTATED RINGERS IV SOLN
INTRAVENOUS | Status: DC
  Administered 2021-08-03: 950 mL via INTRAVENOUS

## 2021-08-02 MED ORDER — LACTATED RINGERS IV SOLN
500.0000 mL | Freq: Once | INTRAVENOUS | Status: AC
  Administered 2021-08-03: 500 mL via INTRAVENOUS

## 2021-08-02 MED ORDER — LIDOCAINE HCL (PF) 1 % IJ SOLN: 30.0000 mL | INTRAMUSCULAR | Status: DC | PRN

## 2021-08-02 MED ORDER — FENTANYL-BUPIVACAINE-NACL 0.5-0.125-0.9 MG/250ML-% EP SOLN
12.0000 mL/h | EPIDURAL | Status: DC | PRN
Start: 2021-08-02 — End: 2021-08-03
  Administered 2021-08-03: 12 mL/h via EPIDURAL
  Filled 2021-08-02: qty 250

## 2021-08-02 MED ORDER — PHENYLEPHRINE 40 MCG/ML (10ML) SYRINGE FOR IV PUSH (FOR BLOOD PRESSURE SUPPORT): 80.0000 ug | PREFILLED_SYRINGE | INTRAVENOUS | Status: DC | PRN

## 2021-08-02 MED ORDER — FENTANYL CITRATE (PF) 100 MCG/2ML IJ SOLN: 50.0000 ug | INTRAMUSCULAR | Status: DC | PRN

## 2021-08-02 MED ORDER — ONDANSETRON HCL 4 MG/2ML IJ SOLN
4.0000 mg | Freq: Four times a day (QID) | INTRAMUSCULAR | Status: DC | PRN
Start: 2021-08-02 — End: 2021-08-03
  Administered 2021-08-03: 4 mg via INTRAVENOUS
  Filled 2021-08-02: qty 2

## 2021-08-02 MED ORDER — LACTATED RINGERS IV SOLN: 500.0000 mL | INTRAVENOUS | Status: DC | PRN

## 2021-08-02 MED ORDER — MISOPROSTOL 200 MCG PO TABS
400.0000 ug | ORAL_TABLET | ORAL | Status: DC
  Administered 2021-08-02 – 2021-08-03 (×6): 400 ug via BUCCAL
  Filled 2021-08-02 (×7): qty 2

## 2021-08-02 MED ORDER — OXYTOCIN-SODIUM CHLORIDE 30-0.9 UT/500ML-% IV SOLN
2.5000 [IU]/h | INTRAVENOUS | Status: DC
  Administered 2021-08-03: 2.5 [IU]/h via INTRAVENOUS
  Filled 2021-08-02: qty 500

## 2021-08-02 MED ORDER — PHENYLEPHRINE 40 MCG/ML (10ML) SYRINGE FOR IV PUSH (FOR BLOOD PRESSURE SUPPORT)
80.0000 ug | PREFILLED_SYRINGE | INTRAVENOUS | Status: DC | PRN
  Filled 2021-08-02: qty 10

## 2021-08-02 MED ORDER — DIPHENHYDRAMINE HCL 50 MG/ML IJ SOLN: 12.5000 mg | INTRAMUSCULAR | Status: DC | PRN

## 2021-08-02 MED ORDER — OXYCODONE-ACETAMINOPHEN 5-325 MG PO TABS: 1.0000 | ORAL_TABLET | ORAL | Status: DC | PRN

## 2021-08-02 MED ORDER — OXYTOCIN BOLUS FROM INFUSION
333.0000 mL | Freq: Once | INTRAVENOUS | Status: AC
Start: 2021-08-02 — End: 2021-08-03
  Administered 2021-08-03: 333 mL via INTRAVENOUS

## 2021-08-02 MED ORDER — SOD CITRATE-CITRIC ACID 500-334 MG/5ML PO SOLN: 30.0000 mL | ORAL | Status: DC | PRN

## 2021-08-02 MED ORDER — FLEET ENEMA 7-19 GM/118ML RE ENEM: 1.0000 | ENEMA | RECTAL | Status: DC | PRN

## 2021-08-02 MED ORDER — OXYCODONE-ACETAMINOPHEN 5-325 MG PO TABS: 2.0000 | ORAL_TABLET | ORAL | Status: DC | PRN

## 2021-08-02 NOTE — MAU Provider Note (Signed)
Event Date/Time   First Provider Initiated Contact with Patient 08/02/21 1646    S [name] is a 35 y.o. G2P1001 patient who presents to MAU today with complaint of IUFD. She was seen in the office this am with decreased fetal movement for the last 2 days and was found to not have heart tones by limited ultrasound.   O BP 113/81 (BP Location: Left Arm)   Pulse (!) 113   Temp 98.3 F (36.8 C) (Oral)   Resp 16   LMP 02/22/2021   SpO2 100%  Physical Exam Vitals and nursing note reviewed.  Constitutional:      General: She is not in acute distress.    Appearance: She is well-developed.  HENT:     Head: Normocephalic.  Eyes:     Pupils: Pupils are equal, round, and reactive to light.  Cardiovascular:     Rate and Rhythm: Normal rate and regular rhythm.     Heart sounds: Normal heart sounds.  Pulmonary:     Effort: Pulmonary effort is normal. No respiratory distress.     Breath sounds: Normal breath sounds.  Abdominal:     General: Bowel sounds are normal. There is no distension.     Palpations: Abdomen is soft.     Tenderness: There is no abdominal tenderness.  Skin:    General: Skin is warm and dry.  Neurological:     Mental Status: She is alert and oriented to person, place, and time.  Psychiatric:        Mood and Affect: Mood normal.        Behavior: Behavior normal.        Thought Content: Thought content normal.        Judgment: Judgment normal.    A Patient brought through MAU for confirmatory u/s and determination of placental location due to previa seen on previous ultrasound. Discussed management and plan of care will be determined by placental location.   Placenta low lying 0.9cm from the os. Discussed plan with Dr. Shawnie Pons and MD ok with IOL. Patient agreeable to plan of care.   P -Admit to labor and delivery for IOL  Rennie Plowman 08/02/2021 4:46 PM

## 2021-08-02 NOTE — H&P (Signed)
OBSTETRIC ADMISSION HISTORY AND PHYSICAL  Rebekah Hicks is a 35 y.o. female G2P1001 with IUP at [redacted]w[redacted]d by LMP presenting for IOL for IUFD. She reports decreased fetal movement for 2 days and was found to not have heart tones in the office. She denies any pain, vaginal bleeding or discharge.   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 She was being seen weekly for FHT doppler due to high risk of IUFD  She received her prenatal care at  [facility]    Dating: By LMP --->  Estimated Date of Delivery: 11/29/21  Past Medical History: Past Medical History:  Diagnosis Date   Migraine    Obesity during pregnancy, antepartum 06/28/2019   Recommendations [x]  Aspirin 81 mg daily after 12 weeks; discontinue 6 weeks postpartum  [ ]  Nutrition consult [ ]  Weight gain 11-20 lbs for singleton and 25-35 lbs for twin pregnancy (IOM guidelines) Higher class of obesity patients recommended to gain closer to lower limit  Weight loss is associated with adverse outcomes [ ]  Baseline and surveillance labs (pulled in from [facility], refresh links as ne   Supervision of normal pregnancy 06/28/2019    Nursing Staff Provider Office Location  [place] Dating  LMP Language  English Anatomy 04[birth year]-1994   f/u in 4 weeks to complete anatomy  Flu Vaccine  07/06/19 Genetic Screen  NIPS: Low Risk female AFP:     TDaP vaccine   10/28/19 Hgb A1C or  GTT Early A1C 5 Third trimester normal Rhogam  N/A   LAB RESULTS  Feeding Plan Breast Blood Type A/RH(D) POSITIVE/-- (09/15 1338)  Contraception List given 2/12 Antibody NO   Traumatic injury during pregnancy in second trimester 10/19/2019   Vaginal Pap smear, abnormal     Past Surgical History: Past Surgical History:  Procedure Laterality Date   NO PAST SURGERIES      Obstetrical History: OB History     Gravida  2   Para  1   Term  1   Preterm  0   AB  0   Living  1      SAB  0   IAB  0   Ectopic  0   Multiple  0   Live Births  1           Social  History Social History   Socioeconomic History   Marital status: Married    Spouse name: 10/30/19   Number of children: Not on file   Years of education: Not on file   Highest education level: Not on file  Occupational History   Occupation: 03-14-2006     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  occ caffeine.    Social Determinants of Health   Financial Resource Strain: Not on file  Food Insecurity: Not on file  Transportation Needs: Not on file  Physical Activity: Not on file  Stress: Not on file  Social Connections: Not on file    Family History: Family  History  Problem Relation Age of Onset   Heart attack Father    Hypertension Father    Hyperlipidemia Father    Diabetes Father    Hyperlipidemia Mother    Hypertension Mother    Diabetes Other        grandparent   Diabetes Maternal Grandmother    Alzheimer's disease Maternal Grandfather    Heart attack Paternal Grandmother    Stroke Paternal Grandmother     Allergies: No Known Allergies  Medications Prior to Admission  Medication Sig Dispense Refill Last Dose   Prenatal Vit-Fe Fumarate-FA (PRENATAL MULTIVITAMIN) TABS tablet Take 1 tablet by mouth daily at 12 noon.        Review of Systems   All systems reviewed and negative except as stated in HPI  Blood pressure 113/81, pulse (!) 113, temperature 98.3 F (36.8 C), temperature source Oral, resp. rate 16, last menstrual period 02/22/2021, SpO2 100 %, not currently breastfeeding. General appearance: alert, cooperative, and no distress Lungs: clear  to auscultation bilaterally Heart: regular rate and rhythm Abdomen: soft, non-tender; bowel sounds normal Pelvic: n/a Extremities: Homans sign is negative, no sign of DVT DTR's +2    Prenatal labs: ABO, Rh: --/--/PENDING (10/21 1704) Antibody: PENDING (10/21 1704) Rubella: 2.68 (07/26 1419) RPR: NON-REACTIVE (07/26 1419)  HBsAg: NON-REACTIVE (07/26 1419)  HIV: NON-REACTIVE (07/26 1419)  GBS:   n/a  Results for orders placed or performed during the hospital encounter of 08/02/21 (from the past 24 hour(s))  Type and screen [facility]   Collection Time: 08/02/21  5:04 PM  Result Value Ref Range   ABO/RH(D) PENDING    Antibody Screen PENDING    Sample Expiration      08/05/2021,2359 Performed at [facility], [address], [place], [place] [address]     Patient Active Problem List   Diagnosis Date Noted   IUFD at 20 weeks or more of gestation 08/02/2021   Abnormal chromosomal and genetic finding on antenatal screening mother 06/29/2021   Placenta previa antepartum in second trimester 06/29/2021   Supervision of other normal pregnancy, antepartum 05/07/2021   Dysplasia of cervix, high grade CIN 2 06/29/2015   Hyperlipidemia, mixed 05/23/2013    Assessment/Plan:  Rebekah Hicks is a 35 y.o. G2P1001 at [redacted]w[redacted]d here for IOL for IUFD.  Confirmed IUFD with formal ultrasound in MAU. Placenta now low lying, 0.9cm from os. Reivewed with Dr. Shawnie Pons and ok for vaginal delivery.  Discussed risks of bleeding at length with patient and MD in house if needed. Patient agreeable to plan of care.  #Labor:cytotec buccally q4h #Pain: Planning epidural. Discussed with patient that IUFD deliveries can happen quickly and encouraged patient to get epidural sooner than later if she intends to.  Patient desires baby to be taken to warmer and cleaned and wrapped up before brought to patient. Patient and support person intend to spend time with infant after  delivery. Discussed that given significant anomalies, CNM is unable to predict how infant will look at delivery and patient verbalized understanding.    Patient desires autopsy and all possible testing to be done after delivery.   Rolm Bookbinder, CNM  08/02/2021, 5:48 PM

## 2021-08-02 NOTE — MAU Note (Signed)
Rebekah Hicks is a 35 y.o. at [redacted]w[redacted]d here in MAU reporting: sent over from the office for IUFD. Denies pain, bleeding, or LOF.  Onset of complaint: today  Pain score: 0/10  Vitals:   08/02/21 1631  BP: 113/81  Pulse: (!) 113  Resp: 16  Temp: 98.3 F (36.8 C)  SpO2: 100%     FHT: N/A  Lab orders placed from triage: none

## 2021-08-02 NOTE — Progress Notes (Signed)
Pt states she has noticed decreased fetal movement for the past 2-3 days

## 2021-08-02 NOTE — Progress Notes (Signed)
Subjective:  Rebekah Hicks is a 35 y.o. G2P1001 at [redacted]w[redacted]d being seen today for ongoing prenatal care.  She is currently monitored for the following issues for this high-risk pregnancy and has Hyperlipidemia, mixed; Dysplasia of cervix, high grade CIN 2; Supervision of other normal pregnancy, antepartum; Abnormal chromosomal and genetic finding on antenatal screening mother; and Placenta previa antepartum in second trimester on their problem list.  Patient reports  no FM x3 days .  Contractions: Not present. Vag. Bleeding: None.  Movement: (!) Decreased. Denies leaking of fluid.   The following portions of the patient's history were reviewed and updated as appropriate: allergies, current medications, past family history, past medical history, past social history, past surgical history and problem list. Problem list updated.  Objective:   Vitals:   08/02/21 0843  BP: 108/73  Pulse: 75  Weight: 221 lb (100.2 kg)    Fetal Status: Fetal Heart Rate (bpm):     Movement: (!) Decreased     General:  Alert, oriented and cooperative. Patient is in no acute distress.  Skin: Skin is warm and dry. No rash noted.   Cardiovascular: Normal heart rate noted  Respiratory: Normal respiratory effort, no problems with respiration noted  Abdomen: Soft, gravid, appropriate for gestational age. Pain/Pressure: Absent     Pelvic: Vag. Bleeding: None Vag D/C Character: Thin   Cervical exam deferred        Extremities: Normal range of motion.  Edema: None  Mental Status: Normal mood and affect. Normal behavior. Normal judgment and thought content.   Urinalysis:      BSUS: no cardiac activity visualized  Assessment and Plan:  Pregnancy: G2P1001 at [redacted]w[redacted]d  1. Abnormal chromosomal and genetic finding on antenatal screening mother  2. Placenta previa antepartum in second trimester  3. Supervision of other normal pregnancy, antepartum  4. [redacted] weeks gestation of pregnancy   5. IUFD - informed pt and  partner, appropriately tearful, condolences given - recommend delivery, pt prefers today - admit to [facility], CNM notified and will notify attending/admitting   No follow-ups on file.   Donette Larry, CNM

## 2021-08-03 ENCOUNTER — Inpatient Hospital Stay (HOSPITAL_COMMUNITY): Payer: BC Managed Care – PPO | Admitting: Anesthesiology

## 2021-08-03 DIAGNOSIS — Z3A23 23 weeks gestation of pregnancy: Secondary | ICD-10-CM

## 2021-08-03 DIAGNOSIS — O328XX0 Maternal care for other malpresentation of fetus, not applicable or unspecified: Secondary | ICD-10-CM

## 2021-08-03 DIAGNOSIS — O364XX0 Maternal care for intrauterine death, not applicable or unspecified: Secondary | ICD-10-CM

## 2021-08-03 LAB — COMPREHENSIVE METABOLIC PANEL
ALT: 9 U/L (ref 0–44)
AST: 14 U/L — ABNORMAL LOW (ref 15–41)
Albumin: 2.8 g/dL — ABNORMAL LOW (ref 3.5–5.0)
Alkaline Phosphatase: 87 U/L (ref 38–126)
Anion gap: 6 (ref 5–15)
BUN: <5 mg/dL — ABNORMAL LOW (ref 6–20)
CO2: 22 mmol/L (ref 22–32)
Calcium: 8.8 mg/dL — ABNORMAL LOW (ref 8.9–10.3)
Chloride: 107 mmol/L (ref 98–111)
Creatinine, Ser: 0.61 mg/dL (ref 0.44–1.00)
GFR, Estimated: >60 mL/min (ref >60)
Glucose, Bld: 81 mg/dL (ref 70–99)
Potassium: 3.6 mmol/L (ref 3.5–5.1)
Sodium: 135 mmol/L (ref 135–145)
Total Bilirubin: 0.5 mg/dL (ref 0.3–1.2)
Total Protein: 6.3 g/dL — ABNORMAL LOW (ref 6.5–8.1)

## 2021-08-03 LAB — DIC (DISSEMINATED INTRAVASCULAR COAGULATION)PANEL
D-Dimer, Quant: 1.79 ug/mL-FEU — ABNORMAL HIGH (ref 0.00–0.50)
Fibrinogen: 601 mg/dL — ABNORMAL HIGH (ref 210–475)
INR: 1.1 (ref 0.8–1.2)
Platelets: 220 10*3/uL (ref 150–400)
Prothrombin Time: 14 seconds (ref 11.4–15.2)
Smear Review: NONE SEEN
aPTT: 29 seconds (ref 24–36)

## 2021-08-03 LAB — CBC
HCT: 34.8 % — ABNORMAL LOW (ref 36.0–46.0)
Hemoglobin: 11.8 g/dL — ABNORMAL LOW (ref 12.0–15.0)
MCH: 30.3 pg (ref 26.0–34.0)
MCHC: 33.9 g/dL (ref 30.0–36.0)
MCV: 89.2 fL (ref 80.0–100.0)
Platelets: 224 10*3/uL (ref 150–400)
RBC: 3.9 MIL/uL (ref 3.87–5.11)
RDW: 13.5 % (ref 11.5–15.5)
WBC: 14 10*3/uL — ABNORMAL HIGH (ref 4.0–10.5)
nRBC: 0 % (ref 0.0–0.2)

## 2021-08-03 LAB — RPR: RPR Ser Ql: NONREACTIVE

## 2021-08-03 MED ORDER — ZOLPIDEM TARTRATE 5 MG PO TABS: 5.0000 mg | ORAL_TABLET | Freq: Every evening | ORAL | Status: DC | PRN

## 2021-08-03 MED ORDER — COCONUT OIL OIL: 1.0000 "application " | TOPICAL_OIL | Status: DC | PRN

## 2021-08-03 MED ORDER — TETANUS-DIPHTH-ACELL PERTUSSIS 5-2.5-18.5 LF-MCG/0.5 IM SUSY: 0.5000 mL | PREFILLED_SYRINGE | Freq: Once | INTRAMUSCULAR | Status: DC

## 2021-08-03 MED ORDER — ALPRAZOLAM 0.5 MG PO TABS
0.5000 mg | ORAL_TABLET | Freq: Once | ORAL | Status: AC
  Administered 2021-08-03: 0.5 mg via ORAL
  Filled 2021-08-03: qty 1

## 2021-08-03 MED ORDER — DIPHENHYDRAMINE HCL 25 MG PO CAPS: 25.0000 mg | ORAL_CAPSULE | Freq: Four times a day (QID) | ORAL | Status: DC | PRN

## 2021-08-03 MED ORDER — MISOPROSTOL 200 MCG PO TABS
400.0000 ug | ORAL_TABLET | Freq: Once | ORAL | Status: AC
  Administered 2021-08-03: 400 ug via BUCCAL

## 2021-08-03 MED ORDER — SENNOSIDES-DOCUSATE SODIUM 8.6-50 MG PO TABS: 2.0000 | ORAL_TABLET | Freq: Every day | ORAL | Status: DC

## 2021-08-03 MED ORDER — ONDANSETRON HCL 4 MG/2ML IJ SOLN: 4.0000 mg | INTRAMUSCULAR | Status: DC | PRN

## 2021-08-03 MED ORDER — ACETAMINOPHEN 325 MG PO TABS: 650.0000 mg | ORAL_TABLET | ORAL | Status: DC | PRN

## 2021-08-03 MED ORDER — PRENATAL MULTIVITAMIN CH: 1.0000 | ORAL_TABLET | Freq: Every day | ORAL | Status: DC

## 2021-08-03 MED ORDER — ONDANSETRON HCL 4 MG PO TABS: 4.0000 mg | ORAL_TABLET | ORAL | Status: DC | PRN

## 2021-08-03 MED ORDER — SIMETHICONE 80 MG PO CHEW
80.0000 mg | CHEWABLE_TABLET | ORAL | Status: DC | PRN
  Filled 2021-08-03: qty 1

## 2021-08-03 MED ORDER — WITCH HAZEL-GLYCERIN EX PADS: 1.0000 "application " | MEDICATED_PAD | CUTANEOUS | Status: DC | PRN

## 2021-08-03 MED ORDER — LIDOCAINE HCL (PF) 1 % IJ SOLN
INTRAMUSCULAR | Status: DC | PRN
  Administered 2021-08-03: 10 mL via EPIDURAL

## 2021-08-03 MED ORDER — IBUPROFEN 600 MG PO TABS: 600.0000 mg | ORAL_TABLET | Freq: Four times a day (QID) | ORAL | Status: DC

## 2021-08-03 MED ORDER — BENZOCAINE-MENTHOL 20-0.5 % EX AERO: 1.0000 "application " | INHALATION_SPRAY | CUTANEOUS | Status: DC | PRN

## 2021-08-03 MED ORDER — TRANEXAMIC ACID-NACL 1000-0.7 MG/100ML-% IV SOLN
1000.0000 mg | INTRAVENOUS | Status: AC
  Administered 2021-08-03: 1000 mg via INTRAVENOUS
  Filled 2021-08-03: qty 100

## 2021-08-03 MED ORDER — DIBUCAINE (PERIANAL) 1 % EX OINT: 1.0000 "application " | TOPICAL_OINTMENT | CUTANEOUS | Status: DC | PRN

## 2021-08-03 NOTE — Progress Notes (Signed)
Rebekah Hicks is a 35 y.o. G2P1001 at 106w1d admitted for induction of labor due to IUFD with Monosomy X and anomalies.  Subjective: Pt comfortable with epidural. Family in room for support.   Objective: BP 102/67   Pulse 79   Temp 98.1 F (36.7 C) (Oral)   Resp 19   Ht 5\' 6"  (1.676 m)   LMP 02/22/2021   SpO2 98%   BMI 35.67 kg/m  No intake/output data recorded. No intake/output data recorded.    SVE:   Dilation: 1 Effacement (%): 90 Station: Ballotable Exam by:: [email or web address], CNM  Labs: Lab Results  Component Value Date   WBC 11.4 (H) 08/02/2021   HGB 12.3 08/02/2021   HCT 37.0 08/02/2021   MCV 90.0 08/02/2021   PLT 242 08/02/2021    Assessment / Plan: Induction of labor due to fetal demise S/P Buccal Cytotec x 3 doses  Labor: Progressing normally Preeclampsia:   n/a Fetal Wellbeing:  n/a Pain Control:  Epidural I/D:  n/a Anticipated MOD:   vaginal delivery  08/04/2021 08/03/2021, 3:04 AM

## 2021-08-03 NOTE — Discharge Summary (Signed)
Postpartum Discharge Summary  Date of Service updated 08/04/21     Patient Name: Rebekah Hicks DOB: 01-01-1986 MRN: [id number]  Date of admission: 08/02/2021 Delivery date:08/03/2021  Delivering provider: Wende Mott  Date of discharge: 08/04/2021  Admitting diagnosis: IUFD at 34 weeks or more of gestation [O36.4XX0] Intrauterine pregnancy: [redacted]w[redacted]d    Secondary diagnosis:  Active Problems:   IUFD at 260 weeksor more of gestation  Additional problems: none    Discharge diagnosis: Preterm Pregnancy Delivered and IUFD                                               Post partum procedures: none Augmentation: Cytotec and IP [facility] Delivery   35y.o. yo G2P1001 at 222w1das admitted to the hospital 08/02/2021 for induction of labor.  Indication for induction:  IUFD .  Patient had an uncomplicated labor course as follows: Membrane Rupture Time/Date: 4:54 PM ,08/03/2021   Delivery Method:Vaginal, Spontaneous  Episiotomy: None  Lacerations:  None  Details of delivery can be found in separate delivery note.  Patient had a routine postpartum course. Patient is discharged home 08/04/21.  Newborn Data: Birth date:08/03/2021  Birth time:5:04 PM  Gender:Female  Living status:Fetal Demise  Apgars:0 ,0  Weight:714 g   Magnesium Sulfate received: No BMZ received: No Rhophylac:N/A MMR:No T-DaP: n/a Flu: No Transfusion:No  Physical exam  Vitals:   08/03/21 1900 08/03/21 2120 08/04/21 0000 08/04/21 0158  BP: 107/77 121/65  100/66  Pulse: 79 86  67  Resp: 16 16    Temp:  98.6 F (37 C) 98.8 F (37.1 C)   TempSrc:  Oral Oral   SpO2:      Weight:      Height:       General: alert, cooperative, and no distress Lochia: appropriate Uterine Fundus: firm Incision: N/A DVT Evaluation: No evidence of DVT seen on physical exam. Labs: Lab Results  Component Value Date   WBC 13.8 (H)  08/04/2021   HGB 10.7 (L) 08/04/2021   HCT 32.3 (L) 08/04/2021   MCV 90.5 08/04/2021   PLT 214 08/04/2021   CMP Latest Ref Rng & Units 08/03/2021  Glucose 70 - 99 mg/dL 81  BUN 6 - 20 mg/dL <5(L)  Creatinine 0.44 - 1.00 mg/dL 0.61  Sodium 135 - 145 mmol/L 135  Potassium 3.5 - 5.1 mmol/L 3.6  Chloride 98 - 111 mmol/L 107  CO2 22 - 32 mmol/L 22  Calcium 8.9 - 10.3 mg/dL 8.8(L)  Total Protein 6.5 - 8.1 g/dL 6.3(L)  Total Bilirubin 0.3 - 1.2 mg/dL 0.5  Alkaline Phos 38 - 126 U/L 87  AST 15 - 41 U/L 14(L)  ALT 0 - 44 U/L 9   Edinburgh Score: Edinburgh Postnatal Depression Scale Screening Tool 02/14/2020  I have been able to laugh and see the funny side of things. 0  I have looked forward with enjoyment to things. 0  I have blamed myself unnecessarily when things went wrong. 0  I have been anxious or worried for no good reason. 0  I have felt scared or panicky for no good reason. 0  Things have been getting on top of me. 0  I have been so unhappy that I have had difficulty sleeping. 0  I have felt  sad or miserable. 0  I have been so unhappy that I have been crying. 0  The thought of harming myself has occurred to me. 0  Edinburgh Postnatal Depression Scale Total 0     After visit meds:  Allergies as of 08/04/2021   No Known Allergies      Medication List     TAKE these medications    acetaminophen 325 MG tablet Commonly known as: Tylenol Take 2 tablets (650 mg total) by mouth every 4 (four) hours as needed for moderate pain or mild pain (for pain scale < 4).   ibuprofen 600 MG tablet Commonly known as: ADVIL Take 1 tablet (600 mg total) by mouth every 6 (six) hours as needed for mild pain or moderate pain.   prenatal multivitamin Tabs tablet Take 1 tablet by mouth daily at 12 noon.         Discharge home in stable condition Infant Feeding: n/a Infant [place] Discharge instruction: per After Visit Summary and Postpartum booklet. Activity: Advance  as tolerated. Pelvic rest for 6 weeks.  Diet: routine diet Future Appointments:No future appointments. Follow up Visit:  [place] [facility] [place]. Schedule an appointment as soon as possible for a visit in 3 week(s).   Specialty: Obstetrics and Gynecology Contact information: [place] [place] [phone]                 Please schedule this patient for a In person postpartum visit in 4 weeks with the following provider: Any provider. Additional Postpartum F/U:Postpartum Depression checkup  High risk pregnancy complicated by:  IUFD, monosomy X, significant anomalies Delivery mode:  Vaginal, Spontaneous  Anticipated Birth Control:  Unsure   08/04/2021 Annalee Genta, DO

## 2021-08-03 NOTE — Anesthesia Procedure Notes (Signed)
Epidural Patient location during procedure: OB Start time: 08/03/2021 1:01 AM End time: 08/03/2021 1:11 AM  Staffing Anesthesiologist: Lucretia Kern, MD Performed: anesthesiologist   Preanesthetic Checklist Completed: patient identified, IV checked, risks and benefits discussed, monitors and equipment checked, pre-op evaluation and timeout performed  Epidural Patient position: sitting Prep: DuraPrep Patient monitoring: heart rate, continuous pulse ox and blood pressure Approach: midline Location: L3-L4 Injection technique: LOR air  Needle:  Needle type: Tuohy  Needle gauge: 17 G Needle length: 9 cm Needle insertion depth: 5 cm Catheter type: closed end flexible Catheter size: 19 Gauge Catheter at skin depth: 10 cm Test dose: negative  Assessment Events: blood not aspirated, injection not painful, no injection resistance, no paresthesia and negative IV test  Additional Notes Reason for block:procedure for pain

## 2021-08-03 NOTE — Progress Notes (Signed)
Rebekah Hicks is a 35 y.o. G2P1001 at [redacted]w[redacted]d by LMP admitted for induction of labor due to IUFD with Monosomy X, cystic hygroma/pleural effusions.  Subjective: Pt comfortable, reports minimal cramping at this time. Family in room for support.  Objective: BP (!) 96/51   Pulse 84   Temp 98.6 F (37 C) (Oral)   Resp 20   Ht 5\' 6"  (1.676 m)   LMP 02/22/2021   SpO2 99%   BMI 35.67 kg/m  No intake/output data recorded. No intake/output data recorded. UC:   none SVE:   Dilation: Fingertip Effacement (%): 50 Exam by:: [email or web address], RN  Labs: Lab Results  Component Value Date   WBC 11.4 (H) 08/02/2021   HGB 12.3 08/02/2021   HCT 37.0 08/02/2021   MCV 90.0 08/02/2021   PLT 242 08/02/2021    Assessment / Plan: Induction of labor due to fetal demise  Labor: Progressing normally Preeclampsia:   n/a Fetal Wellbeing:  n/a Pain Control:  Labor support without medications I/D:  n/a Anticipated MOD:   vaginal delivery  08/04/2021 08/03/2021, 1:20 AM

## 2021-08-03 NOTE — Progress Notes (Signed)
Patient now 5cm/100/0. Comfortable with epidural. Patient in high fowlers and will reassess in 1 hour.   Rolm Bookbinder, CNM 08/03/21 2:31 PM

## 2021-08-03 NOTE — Progress Notes (Signed)
Labor Progress Note Rebekah Hicks is a 35 y.o. G2P1001 at [redacted]w[redacted]d presented for IOL for IUFD  S:  Patient comfortable with epidural, not feeling pressure  O:  BP 114/77   Pulse 79   Temp 98.1 F (36.7 C) (Oral)   Resp 16   Ht 5\' 6"  (1.676 m)   LMP 02/22/2021   SpO2 98%   BMI 35.67 kg/m   CVE: Dilation: 1 Effacement (%): 90 Station: Ballotable Presentation: Undeterminable Exam by:: [email or web address], CNM   [name]: 35 y.o. G2P1001 [redacted]w[redacted]d IOL for IUFD #Labor: Consulted with Dr. [redacted]w[redacted]d- ok to increase cytotec to [name] for one dose. Discussed possibly placing FB and patient desires to discuss with partner.  #Pain: epidural  , CNM 11:32 AM

## 2021-08-03 NOTE — Anesthesia Preprocedure Evaluation (Signed)
Anesthesia Evaluation  Patient identified by MRN, date of birth, ID band Patient awake    Reviewed: Allergy & Precautions, H&P , NPO status , Patient's Chart, lab work & pertinent test results  History of Anesthesia Complications Negative for: history of anesthetic complications  Airway Mallampati: II  TM Distance: >3 FB     Dental   Pulmonary neg pulmonary ROS,    Pulmonary exam normal        Cardiovascular negative cardio ROS   Rhythm:regular Rate:Normal     Neuro/Psych negative neurological ROS  negative psych ROS   GI/Hepatic negative GI ROS, Neg liver ROS,   Endo/Other  negative endocrine ROS  Renal/GU negative Renal ROS  negative genitourinary   Musculoskeletal   Abdominal   Peds  Hematology negative hematology ROS (+)   Anesthesia Other Findings   Reproductive/Obstetrics (+) Pregnancy (IUFD)                             Anesthesia Physical Anesthesia Plan  ASA: 2  Anesthesia Plan: Epidural   Post-op Pain Management:    Induction:   PONV Risk Score and Plan:   Airway Management Planned:   Additional Equipment:   Intra-op Plan:   Post-operative Plan:   Informed Consent: I have reviewed the patients History and Physical, chart, labs and discussed the procedure including the risks, benefits and alternatives for the proposed anesthesia with the patient or authorized representative who has indicated his/her understanding and acceptance.       Plan Discussed with:   Anesthesia Plan Comments:         Anesthesia Quick Evaluation

## 2021-08-04 ENCOUNTER — Encounter (HOSPITAL_COMMUNITY): Payer: Self-pay | Admitting: Obstetrics and Gynecology

## 2021-08-04 LAB — CMV ANTIBODY, IGG (EIA): CMV Ab - IgG: >10 U/mL — ABNORMAL HIGH (ref 0.00–0.59)

## 2021-08-04 LAB — TOXOPLASMA GONDII ANTIBODY, IGG: Toxoplasma IgG Ratio: <3 IU/mL (ref 0.0–7.1)

## 2021-08-04 LAB — CBC
HCT: 32.3 % — ABNORMAL LOW (ref 36.0–46.0)
Hemoglobin: 10.7 g/dL — ABNORMAL LOW (ref 12.0–15.0)
MCH: 30 pg (ref 26.0–34.0)
MCHC: 33.1 g/dL (ref 30.0–36.0)
MCV: 90.5 fL (ref 80.0–100.0)
Platelets: 214 10*3/uL (ref 150–400)
RBC: 3.57 MIL/uL — ABNORMAL LOW (ref 3.87–5.11)
RDW: 13.3 % (ref 11.5–15.5)
WBC: 13.8 10*3/uL — ABNORMAL HIGH (ref 4.0–10.5)
nRBC: 0 % (ref 0.0–0.2)

## 2021-08-04 LAB — HSV 1 ANTIBODY, IGG: HSV 1 Glycoprotein G Ab, IgG: 29.9 index — ABNORMAL HIGH (ref 0.00–0.90)

## 2021-08-04 LAB — RUBELLA SCREEN: Rubella: 2.28 index (ref >0.99)

## 2021-08-04 LAB — HSV 2 ANTIBODY, IGG: HSV 2 Glycoprotein G Ab, IgG: <0.91 index (ref 0.00–0.90)

## 2021-08-04 MED ORDER — ACETAMINOPHEN 325 MG PO TABS: 650.0000 mg | ORAL_TABLET | ORAL | Status: DC | PRN

## 2021-08-04 MED ORDER — IBUPROFEN 600 MG PO TABS: 600.0000 mg | ORAL_TABLET | Freq: Four times a day (QID) | ORAL | 0 refills | Status: DC | PRN

## 2021-08-05 NOTE — Addendum Note (Signed)
Addendum  created 08/05/21 1321 by Algis Greenhouse, CRNA   Clinical Note Signed

## 2021-08-05 NOTE — Anesthesia Postprocedure Evaluation (Signed)
Anesthesia Post Note  Patient: Rebekah Hicks  Procedure(s) Performed: AN AD HOC LABOR EPIDURAL     Anesthesia Type: Epidural Anesthetic complications: no   No notable events documented.  Last Vitals:  Vitals:   08/04/21 0158 08/04/21 0825  BP: 100/66 106/67  Pulse: 67 67  Resp:  16  Temp:  36.6 C  SpO2:      Last Pain:  Vitals:   08/04/21 0825  TempSrc: Oral  PainSc: 0-No pain   Pain Goal:         Pt d/c to home after delivery of IUFD. Called cell phone twice and left messages to return our call.          Cephus Shelling

## 2021-08-05 NOTE — Anesthesia Postprocedure Evaluation (Signed)
Anesthesia Post Note  Patient: Rebekah Hicks  Procedure(s) Performed: AN AD HOC LABOR EPIDURAL     Patient location during evaluation: Mother Baby Anesthesia Type: Epidural Level of consciousness: awake Pain management: satisfactory to patient Vital Signs Assessment: post-procedure vital signs reviewed and stable Respiratory status: spontaneous breathing Cardiovascular status: stable Anesthetic complications: no   No notable events documented.  Last Vitals:  Vitals:   08/04/21 0158 08/04/21 0825  BP: 100/66 106/67  Pulse: 67 67  Resp:  16  Temp:  36.6 C  SpO2:      Last Pain:  Vitals:   08/04/21 0825  TempSrc: Oral  PainSc: 0-No pain   Pain Goal:       Pt returned phone call and has no complaints.  Ambulating at home.            Cephus Shelling

## 2021-08-06 LAB — SURGICAL PATHOLOGY

## 2021-08-07 ENCOUNTER — Encounter: Payer: Self-pay | Admitting: *Deleted

## 2021-08-07 ENCOUNTER — Telehealth: Payer: Self-pay | Admitting: *Deleted

## 2021-08-07 NOTE — Telephone Encounter (Signed)
Left patient a message to call and schedule Postpartum 1 week mood check appointment, in office.

## 2021-08-07 NOTE — Telephone Encounter (Signed)
-----   Message from Kathie Dike, [place] sent at 08/05/2021  7:55 AM EDT ----- Regarding: FW: PPMSG  ----- Message ----- From: Rolm Bookbinder, CNM Sent: 08/03/2021   5:58 PM EDT To: [facility] All Clinical Pool Subject: PPMSG                                          Please schedule this patient for Postpartum visit in: 4 weeks with the following provider: Any provider In-Person For C/S patients schedule nurse incision check in weeks 2 weeks: no High risk pregnancy complicated by: ##IUFD## Delivery mode:  SVD Anticipated Birth Control:  other/unsure PP Procedures needed: 1 week mood check

## 2021-08-07 NOTE — Progress Notes (Signed)
FMLA scanned into chart

## 2021-08-09 ENCOUNTER — Ambulatory Visit (INDEPENDENT_AMBULATORY_CARE_PROVIDER_SITE_OTHER): Payer: BC Managed Care – PPO

## 2021-08-09 ENCOUNTER — Other Ambulatory Visit: Payer: Self-pay

## 2021-08-09 DIAGNOSIS — R030 Elevated blood-pressure reading, without diagnosis of hypertension: Secondary | ICD-10-CM

## 2021-08-09 DIAGNOSIS — Z9189 Other specified personal risk factors, not elsewhere classified: Secondary | ICD-10-CM

## 2021-08-09 MED ORDER — AMLODIPINE BESYLATE 5 MG PO TABS: 5.0000 mg | ORAL_TABLET | Freq: Every day | ORAL | 1 refills | Status: DC

## 2021-08-09 NOTE — Progress Notes (Signed)
History:  [name] is a 35 y.o. G2P1101 who presents to clinic today for a postpartum mood check after delivery of an IUFD at 23 weeks. Patient appropriately tearful. Reports she is doing some normal activities thanks to her toddler. Denies any suicidal ideation and reports she is taking things one day at a time. Denies any pain, reports some bright red vaginal bleeding still.   The following portions of the patient's history were reviewed and updated as appropriate: allergies, current medications, family history, past medical history, social history, past surgical history and problem list.  Review of Systems:  Review of Systems  Constitutional: Negative.  Negative for chills and fever.  Respiratory: Negative.    Cardiovascular: Negative.  Negative for chest pain.  Genitourinary: Negative.   Neurological: Negative.  Negative for dizziness and headaches.     Objective:  Physical Exam BP 133/86   Pulse 68   LMP 02/22/2021  Physical Exam Vitals and nursing note reviewed.  Constitutional:      General: She is not in acute distress.    Appearance: She is well-developed.  HENT:     Head: Normocephalic.  Eyes:     Pupils: Pupils are equal, round, and reactive to light.  Cardiovascular:     Rate and Rhythm: Normal rate and regular rhythm.  Pulmonary:     Effort: Pulmonary effort is normal. No respiratory distress.     Breath sounds: Normal breath sounds.  Abdominal:     Palpations: Abdomen is soft.     Tenderness: There is no abdominal tenderness.  Musculoskeletal:        General: Normal range of motion.     Cervical back: Normal range of motion.  Skin:    General: Skin is warm and dry.  Neurological:     Mental Status: She is alert and oriented to person, place, and time.  Psychiatric:        Behavior: Behavior normal.        Thought Content: Thought content normal.        Judgment: Judgment normal.    Assessment & Plan:  1. Postpartum state -Coping well  at this time. States she has lots of support and is appreciative of that.   2. At risk for depressed mood -Discussed possible need for medication to stabilize mood and appropriateness if needed. Encouraged patient to call office if she decides she needs medication and CNM will send it in to pharmacy.   3. Elevated blood pressure reading -Initial BP 140s/90s. Repeat improved but borderline hypertensive. Denies HA, visual changes or epigastric pain. Will start PO antihypertensives and reviewed preeclampsia precautions at length.    Rolm Bookbinder, [place] 08/09/2021 11:13 AM

## 2021-08-13 ENCOUNTER — Ambulatory Visit (INDEPENDENT_AMBULATORY_CARE_PROVIDER_SITE_OTHER): Payer: BC Managed Care – PPO | Admitting: Advanced Practice Midwife

## 2021-08-13 ENCOUNTER — Other Ambulatory Visit: Payer: Self-pay

## 2021-08-13 ENCOUNTER — Encounter: Payer: Self-pay | Admitting: Advanced Practice Midwife

## 2021-08-13 VITALS — BP 100/77 | HR 75 | Temp 98.3°F | Ht 67.0 in | Wt 212.0 lb

## 2021-08-13 DIAGNOSIS — O8612 Endometritis following delivery: Secondary | ICD-10-CM

## 2021-08-13 MED ORDER — CIPROFLOXACIN HCL 500 MG PO TABS: 500.0000 mg | ORAL_TABLET | Freq: Two times a day (BID) | ORAL | 0 refills | Status: DC

## 2021-08-13 MED ORDER — FLUCONAZOLE 150 MG PO TABS: 150.0000 mg | ORAL_TABLET | Freq: Once | ORAL | 0 refills | Status: AC

## 2021-08-13 MED ORDER — METRONIDAZOLE 500 MG PO TABS: 500.0000 mg | ORAL_TABLET | Freq: Two times a day (BID) | ORAL | 0 refills | Status: AC

## 2021-08-13 NOTE — Progress Notes (Signed)
   GYNECOLOGY PROGRESS NOTE  History:  35 y.o. G2P1101 presents to [facility] office today for problem gyn visit. She reports intermittent LLQ pain since delivery of IUFD at 23 weeks on 08/03/21.  Yesterday she had a slight increase in pain and onset of fever/chills. Lochia are light and have not changed. She had a temp x 1 of 102 at home which resolved with Tylenol. She last took Tylenol this morning. She also reports scratchy throat and nasal congestion for a few days. She denies sick contacts.   The following portions of the patient's history were reviewed and updated as appropriate: allergies, current medications, past family history, past medical history, past social history, past surgical history and problem list. Last pap smear on 05/07/21 was normal, negative HRHPV.  Health Maintenance Due  Topic Date Due   COVID-19 Vaccine (1) Never done   INFLUENZA VACCINE  05/13/2021     Review of Systems:  Pertinent items are noted in HPI.   Objective:  Physical Exam Blood pressure 100/77, pulse 75, temperature 98.3 F (36.8 C), height 5\' 7"  (1.702 m), weight 212 lb (96.2 kg), last menstrual period 02/22/2021, not currently breastfeeding. VS reviewed, nursing note reviewed,  Constitutional: well developed, well nourished, no distress HEENT: normocephalic CV: normal rate Pulm/chest wall: normal effort Breast Exam: deferred Abdomen: soft, nontender Neuro: alert and oriented x 3 Skin: warm, dry Psych: affect normal Pelvic exam: Deferred  Assessment & Plan:  1. Endometritis following delivery --Fever may be related to URI but is intermittent and is associated with abdominal pain --Consult Dr 02/24/2021 given pt increased risks with early delivery and Rx for abx for 7 day course recommended. --For URI, rest, increase PO fluids, consider COVID and or flu test - ciprofloxacin (CIPRO) 500 MG tablet; Take 1 tablet (500 mg total) by mouth 2 (two) times daily.  Dispense: 14 tablet; Refill:  0 - metroNIDAZOLE (FLAGYL) 500 MG tablet; Take 1 tablet (500 mg total) by mouth 2 (two) times daily for 7 days.  Dispense: 14 tablet; Refill: 0 --Diflucan Rx PRN for yeast after 2 abx    Adrian Blackwater, CNM 10:39 AM

## 2021-08-13 NOTE — Progress Notes (Signed)
Pt c/o abdominal pain and low grade fever No fever in office today- Pt took Tylenol Pt states she has a sore throat and congestion

## 2021-08-14 ENCOUNTER — Telehealth: Payer: Self-pay

## 2021-08-14 ENCOUNTER — Telehealth (HOSPITAL_COMMUNITY): Payer: Self-pay | Admitting: *Deleted

## 2021-08-14 NOTE — Telephone Encounter (Signed)
Attempted to call patient to review results of Anora. Left message and will attempt to call at another time.   Rolm Bookbinder, CNM 08/14/21 10:04 AM

## 2021-08-20 ENCOUNTER — Encounter: Payer: Self-pay | Admitting: *Deleted

## 2021-08-20 ENCOUNTER — Ambulatory Visit: Payer: No Typology Code available for payment source

## 2021-08-25 NOTE — Progress Notes (Deleted)
   GYNECOLOGY OFFICE VISIT NOTE  History:   Rebekah Hicks is a 35 y.o. G2P1101 here today for follow up from her delivery on 10/23. 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    She denies any abnormal vaginal discharge, bleeding, pelvic pain or other concerns.   The following portions of the patient's history were reviewed and updated as appropriate: allergies, current medications, past family history, past medical history, past social history, past surgical history and problem list.   Health Maintenance:    Diagnosis  Date Value Ref Range Status  05/07/2021   Final   - Negative for intraepithelial lesion or malignancy (NILM)     Review of Systems:  Pertinent items noted in HPI and remainder of comprehensive ROS otherwise negative.  Physical Exam:  LMP 02/22/2021  CONSTITUTIONAL: Well-developed, well-nourished female in no acute distress.  HEENT:  Normocephalic, atraumatic. External right and left ear normal. No scleral icterus.  NECK: Normal range of motion, supple, no masses noted on observation SKIN: No rash noted. Not diaphoretic. No erythema. No pallor. MUSCULOSKELETAL: Normal range of motion. No edema noted. NEUROLOGIC: Alert and oriented to person, place, and time. Normal muscle tone coordination. No cranial nerve deficit noted. PSYCHIATRIC: Normal mood and affect. Normal behavior. Normal judgment and thought content.  CARDIOVASCULAR: Normal heart rate noted RESPIRATORY: Effort and breath sounds normal, no problems with respiration noted ABDOMEN: No masses noted. No other overt distention noted.    PELVIC: Deferred  Labs and Imaging  Assessment and Plan:    1. Postpartum state *** - Mood: *** - Birth Control: *** - Blood pressures: *** - She had her PP course complicated by possibly endometritis but also possibly from URI. She was given Cipro and Flagyl empirically and has now completed her course.  Pathology from placenta had shown chorioamnionitis. She now feels ***.   Routine preventative health maintenance measures emphasized. Please refer to After Visit Summary for other counseling recommendations.   No follow-ups on file.  I spent {Blank single:[id number]::"10","15","20","25","30"} minutes dedicated to the care of this patient including pre-visit review of records, face to face time with the patient discussing her conditions and treatments and post visit orders.    Milas Hock, MD, FACOG Obstetrician & Gynecologist, [facility] for [facility], [facility]

## 2021-08-29 ENCOUNTER — Ambulatory Visit: Payer: BC Managed Care – PPO | Admitting: Obstetrics and Gynecology

## 2021-08-31 ENCOUNTER — Other Ambulatory Visit: Payer: Self-pay

## 2021-09-03 ENCOUNTER — Encounter: Payer: Self-pay | Admitting: Obstetrics and Gynecology

## 2021-09-03 ENCOUNTER — Ambulatory Visit (INDEPENDENT_AMBULATORY_CARE_PROVIDER_SITE_OTHER): Payer: BC Managed Care – PPO | Admitting: Obstetrics and Gynecology

## 2021-09-03 ENCOUNTER — Other Ambulatory Visit: Payer: Self-pay

## 2021-09-03 DIAGNOSIS — Z8759 Personal history of other complications of pregnancy, childbirth and the puerperium: Secondary | ICD-10-CM

## 2021-09-03 NOTE — Progress Notes (Signed)
Post Partum Visit Note  Rebekah Hicks is a 35 y.o. G49P1101 female who presents for a postpartum visit. She is 4 weeks postpartum following a IOL for IUFD.  I have fully reviewed the prenatal and intrapartum course. The delivery was at 23 gestational weeks.  Anesthesia: epidural. Postpartum course has been remarkable.  Bleeding no bleeding. Bowel function is normal. Bladder function is normal. Patient is not sexually active. Contraception method is OCP (estrogen/progesterone). Postpartum depression screening: negative.   The pregnancy intention screening data noted above was reviewed. Potential methods of contraception were discussed. The patient elected to proceed with No data recorded.   Edinburgh Postnatal Depression Scale - 09/03/21 1535       Edinburgh Postnatal Depression Scale:  In the Past 7 Days   I have been able to laugh and see the funny side of things. 0    I have looked forward with enjoyment to things. 0    I have blamed myself unnecessarily when things went wrong. 0    I have been anxious or worried for no good reason. 2    I have felt scared or panicky for no good reason. 2    Things have been getting on top of me. 1    I have been so unhappy that I have had difficulty sleeping. 1    I have felt sad or miserable. 1    I have been so unhappy that I have been crying. 2    The thought of harming myself has occurred to me. 0    Edinburgh Postnatal Depression Scale Total 9             Health Maintenance Due  Topic Date Due   COVID-19 Vaccine (1) Never done   INFLUENZA VACCINE  05/13/2021    The following portions of the patient's history were reviewed and updated as appropriate: allergies, current medications, past family history, past medical history, past social history, past surgical history, and problem list.  Review of Systems Pertinent items are noted in HPI.  Objective:  BP 120/73   Pulse 71   Resp 16   Ht 5\' 6"  (1.676 m)   Wt 213 lb (96.6  kg)   LMP 02/22/2021   BMI 34.38 kg/m    General:  appears stated age       Assessment:  Normal postpartum exam.  Overall doing well.  Desires OCP's   Plan:   Essential components of care per ACOG recommendations:  1.  Mood and well being: Patient with negative depression screening today. Reviewed local resources for support.  - Patient tobacco use? No.   - hx of drug use? No.    2. Infant care and feeding:  -Patient currently breastmilk feeding? No.  -Social determinants of health (SDOH) reviewed in EPIC. No concerns 3. Sexuality, contraception and birth spacing - Patient does not want a pregnancy in the next year.   - Reviewed forms of contraception in tiered fashion. Patient desired oral contraceptives (estrogen/progesterone) today.   - Discussed birth spacing of 18 months  4. Sleep and fatigue -Encouraged family/partner/community support of 4 hrs of uninterrupted sleep to help with mood and fatigue  5. Physical Recovery  - Discussed patients delivery and complications. She describes her labor as mixed. - Patient had a Vaginal problems after delivery including IUFD . Patient had a  None  laceration. Perineal healing reviewed. Patient expressed understanding - Patient has urinary incontinence? No. - Patient is safe to resume physical  and sexual activity  6.  Health Maintenance - HM due items addressed Yes - Last pap smear  Diagnosis  Date Value Ref Range Status  05/07/2021   Final   - Negative for intraepithelial lesion or malignancy (NILM)   Pap smear not done at today's visit.  -Breast Cancer screening indicated? No.   7. Chronic Disease/Pregnancy Condition follow up: None Ok to DC blood pressure medication.   - PCP follow up  Granville Lewis, RN Center for [facility], [facility]    Luismario Coston, Harolyn Rutherford, NP 09/05/2021  11:25 PM

## 2021-09-03 NOTE — Progress Notes (Signed)
Pt states that she forgot to take her Norvasc today. She does want to use OCP's for contraception and may attempt another pregnancy next year.

## 2021-09-04 ENCOUNTER — Other Ambulatory Visit: Payer: Self-pay | Admitting: *Deleted

## 2021-09-04 MED ORDER — NORGESTIMATE-ETH ESTRADIOL 0.25-35 MG-MCG PO TABS: 1.0000 | ORAL_TABLET | Freq: Every day | ORAL | 11 refills | Status: DC

## 2021-09-04 NOTE — Progress Notes (Signed)
RX for Sprintec se to pt's pharmacy with 1 year RF's per VO J Rasch,NP

## 2021-09-05 DIAGNOSIS — Z8759 Personal history of other complications of pregnancy, childbirth and the puerperium: Secondary | ICD-10-CM | POA: Insufficient documentation

## 2022-04-25 ENCOUNTER — Ambulatory Visit: Payer: Self-pay

## 2022-04-25 ENCOUNTER — Encounter: Payer: Self-pay | Admitting: Obstetrics and Gynecology

## 2022-04-25 DIAGNOSIS — O0990 Supervision of high risk pregnancy, unspecified, unspecified trimester: Secondary | ICD-10-CM

## 2022-04-28 ENCOUNTER — Other Ambulatory Visit (HOSPITAL_COMMUNITY)
Admission: RE | Admit: 2022-04-28 | Discharge: 2022-04-28 | Disposition: A | Discharge status: Home / Self Care | Payer: BC Managed Care – PPO | Source: Ambulatory Visit | Attending: Obstetrics and Gynecology | Admitting: Obstetrics and Gynecology

## 2022-04-28 ENCOUNTER — Encounter: Payer: Self-pay | Admitting: Obstetrics and Gynecology

## 2022-04-28 ENCOUNTER — Ambulatory Visit (INDEPENDENT_AMBULATORY_CARE_PROVIDER_SITE_OTHER): Payer: BC Managed Care – PPO | Admitting: Obstetrics and Gynecology

## 2022-04-28 ENCOUNTER — Ambulatory Visit (INDEPENDENT_AMBULATORY_CARE_PROVIDER_SITE_OTHER): Payer: BC Managed Care – PPO

## 2022-04-28 VITALS — BP 106/71 | HR 63 | Wt 186.0 lb

## 2022-04-28 DIAGNOSIS — O0991 Supervision of high risk pregnancy, unspecified, first trimester: Secondary | ICD-10-CM

## 2022-04-28 DIAGNOSIS — Z8759 Personal history of other complications of pregnancy, childbirth and the puerperium: Secondary | ICD-10-CM

## 2022-04-28 DIAGNOSIS — Z3401 Encounter for supervision of normal first pregnancy, first trimester: Secondary | ICD-10-CM

## 2022-04-28 DIAGNOSIS — O0990 Supervision of high risk pregnancy, unspecified, unspecified trimester: Secondary | ICD-10-CM | POA: Insufficient documentation

## 2022-04-28 DIAGNOSIS — O09529 Supervision of elderly multigravida, unspecified trimester: Secondary | ICD-10-CM

## 2022-04-28 DIAGNOSIS — Z3402 Encounter for supervision of normal first pregnancy, second trimester: Secondary | ICD-10-CM

## 2022-04-28 DIAGNOSIS — Z3A11 11 weeks gestation of pregnancy: Secondary | ICD-10-CM | POA: Diagnosis not present

## 2022-04-28 DIAGNOSIS — Z3A10 10 weeks gestation of pregnancy: Secondary | ICD-10-CM

## 2022-04-28 LAB — HEPATITIS C ANTIBODY: HCV Ab: NEGATIVE

## 2022-04-28 NOTE — Progress Notes (Signed)
Bedside U/S shows single IUP with FHT of 164 BPM  CRL measuring GA @ [redacted]w[redacted]d

## 2022-04-28 NOTE — Progress Notes (Signed)
History:   Rebekah Hicks is a 36 y.o. G3P1101 at [redacted]w[redacted]d by early ultrasound being seen today for her first obstetrical visit.   Patient does intend to breast feed.   Pregnancy history fully reviewed. Obstetrical history is significant for: G1: FT SVD, uncomplicated. Labor was 34h, pushed for about 2 hr G2: 23wk loss d/t monosomy x, cystic hygroma  Patient reports no complaints.      HISTORY: OB History  Gravida Para Term Preterm AB Living  3 2 1 1  0 1  SAB IAB Ectopic Multiple Live Births  0 0 0 0 1    # Outcome Date GA Lbr Len/2nd Weight Sex Delivery Anes PTL Lv  3 Current           2 Preterm 08/03/21 [redacted]w[redacted]d 05:23 / 00:11 1 lb 9.2 oz (0.714 kg) F Vag-Spont EPI  FD     Birth Comments: cystic hygroma, monosomy x, hydrops     Name: Rebekah Hicks FD     Apgar1: 0  Apgar5: 0  1 Term 01/14/20 [redacted]w[redacted]d 05:40 / 04:54 7 lb 6.3 oz (3.355 kg) M Vag-Spont EPI  LIV     Name: Rebekah Hicks     Apgar1: 8  Apgar5: 9     Last pap smear was done 04/2021 and was normal Lab Results  Component Value Date   DIAGPAP  05/07/2021    - Negative for intraepithelial lesion or malignancy (NILM)   DIAGPAP  05/14/2017    NEGATIVE FOR INTRAEPITHELIAL LESIONS OR MALIGNANCY.   HPV NOT DETECTED 05/14/2017   HPVHIGH Negative 05/07/2021     Past Medical History:  Diagnosis Date   Migraine    Obesity during pregnancy, antepartum 06/28/2019   Recommendations [x]  Aspirin 81 mg daily after 12 weeks; discontinue 6 weeks postpartum  [ ]  Nutrition consult [ ]  Weight gain 11-20 lbs for singleton and 25-35 lbs for twin pregnancy (IOM guidelines) Higher class of obesity patients recommended to gain closer to lower limit  Weight loss is associated with adverse outcomes [ ]  Baseline and surveillance labs (pulled in from , refresh links as ne   Supervision of normal pregnancy 06/28/2019    Nursing Staff Provider Office Location  [place] Dating  LMP Language  English Anatomy   f/u in 4 weeks to  complete anatomy  Flu Vaccine  07/06/19 Genetic Screen  NIPS: Low Risk female AFP:     TDaP vaccine   10/28/19 Hgb A1C or  GTT Early A1C 5 Third trimester normal Rhogam  N/A   LAB RESULTS  Feeding Plan Breast Blood Type A/RH(D) POSITIVE/-- (09/15 1338)  Contraception List given 2/12 Antibody NO   Traumatic injury during pregnancy in second trimester 10/19/2019   Vaginal Pap smear, abnormal    Past Surgical History:  Procedure Laterality Date   NO PAST SURGERIES     Family History  Problem Relation Age of Onset   Heart attack Father    Hypertension Father    Hyperlipidemia Father    Diabetes Father    Hyperlipidemia Mother    Hypertension Mother    Diabetes Other        grandparent   Diabetes Maternal Grandmother    Alzheimer's disease Maternal Grandfather    Heart attack Paternal Grandmother    Stroke Paternal Grandmother    Social History   Tobacco Use   Smoking status: Never   Smokeless tobacco: Never  Vaping Use   Vaping Use: Never used  Substance Use Topics  Alcohol use: Not Currently    Alcohol/week: 1.0 standard drink of alcohol    Types: 1 Standard drinks or equivalent per week   Drug use: No   No Known Allergies Current Outpatient Medications on File Prior to Visit  Medication Sig Dispense Refill   Prenatal Vit-Fe Fumarate-FA (PRENATAL MULTIVITAMIN) TABS tablet Take 1 tablet by mouth daily at 12 noon.     No current facility-administered medications on file prior to visit.    Review of Systems Pertinent items noted in HPI and remainder of comprehensive ROS otherwise negative.  Physical Exam:   Vitals:   04/28/22 1030  BP: 106/71  Pulse: 63  Weight: 186 lb (84.4 kg)   Fetal Heart Rate (bpm): 164 Bedside Ultrasound for FHR check: Viable intrauterine pregnancy with positive cardiac activity noted  Patient informed that the ultrasound is considered a limited obstetric ultrasound and is not intended to be a complete ultrasound exam.  Patient also informed  that the ultrasound is not being completed with the intent of assessing for fetal or placental anomalies or any pelvic abnormalities.  Explained that the purpose of today's ultrasound is to assess for fetal heart rate.  Patient acknowledges the purpose of the exam and the limitations of the study.  General: well-developed, well-nourished female in no acute distress  Breasts:  normal appearance, no masses or tenderness bilaterally  Skin: normal coloration and turgor, no rashes  Neurologic: oriented, normal, negative, normal mood  Extremities: normal strength, tone, and muscle mass, ROM of all joints is normal  HEENT PERRLA, extraocular movement intact and sclera clear, anicteric  Neck supple and no masses  Cardiovascular: regular rate and rhythm  Respiratory:  no respiratory distress, normal breath sounds  Abdomen: soft, non-tender; bowel sounds normal; no masses,  no organomegaly  Pelvic: Not examined    Assessment:    Pregnancy: [id number] Patient Active Problem List   Diagnosis Date Noted   Supervision of high risk pregnancy, antepartum 04/25/2022   History of IUFD 09/05/2021   Dysplasia of cervix, high grade CIN 2 06/29/2015   Hyperlipidemia, mixed 05/23/2013     Plan:    1. Supervision of high risk pregnancy, antepartum - Pap up to date - US OB Limited; Future - Obstetric panel - HIV antibody (with reflex) - Hepatitis C Antibody - Culture, OB Urine - GC/Chlamydia probe amp ([place])not at [facility] - Babyscripts Schedule Optimization - US OB Limited; Future  2. 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 Desires Genetic testing for this reason.   3. Encounter for supervision of normal first pregnancy in second trimester - Panorama Prenatal Test Full Panel  4. [redacted] weeks gestation of pregnancy  Initial labs drawn. Continue prenatal vitamins. Problem list reviewed and updated. Genetic Screening discussed, NIPS: ordered. Ultrasound discussed; fetal anatomic survey:  ordered. Anticipatory guidance about prenatal visits given including labs, ultrasounds, and testing. Discussed usage of Babyscripts and virtual visits  The nature of [place] - Center for [facility] Practice with multiple MDs and Advanced Practice Providers was explained to patient; also emphasized that residents, students are part of our team. Routine obstetric precautions reviewed. Encouraged to seek out care at office or emergency room [facility] preferred) for urgent and/or emergent concerns. Return in about 4 weeks (around 05/26/2022) for OB VISIT, MD or APP.    Milas Hock, MD, FACOG Obstetrician & Gynecologist, [facility], [facility]

## 2022-04-29 LAB — GC/CHLAMYDIA PROBE AMP (~~LOC~~) NOT AT ARMC
Chlamydia: NEGATIVE
Comment: NEGATIVE
Comment: NORMAL
Neisseria Gonorrhea: NEGATIVE

## 2022-04-30 LAB — OBSTETRIC PANEL
Absolute Monocytes: 672 cells/uL (ref 200–950)
Antibody Screen: NOT DETECTED
Basophils Absolute: 48 cells/uL (ref 0–200)
Basophils Relative: 0.6 %
Eosinophils Absolute: 64 cells/uL (ref 15–500)
Eosinophils Relative: 0.8 %
HCT: 40 % (ref 35.0–45.0)
Hemoglobin: 13.2 g/dL (ref 11.7–15.5)
Hepatitis B Surface Ag: NONREACTIVE
Lymphs Abs: 1928 cells/uL (ref 850–3900)
MCH: 30.1 pg (ref 27.0–33.0)
MCHC: 33 g/dL (ref 32.0–36.0)
MCV: 91.1 fL (ref 80.0–100.0)
MPV: 11.5 fL (ref 7.5–12.5)
Monocytes Relative: 8.4 %
Neutro Abs: 5288 cells/uL (ref 1500–7800)
Neutrophils Relative %: 66.1 %
Platelets: 227 10*3/uL (ref 140–400)
RBC: 4.39 10*6/uL (ref 3.80–5.10)
RDW: 12 % (ref 11.0–15.0)
RPR Ser Ql: NONREACTIVE
Rubella: 2.14 Index
Total Lymphocyte: 24.1 %
WBC: 8 10*3/uL (ref 3.8–10.8)

## 2022-04-30 LAB — HEPATITIS C ANTIBODY: Hepatitis C Ab: NONREACTIVE

## 2022-04-30 LAB — HIV ANTIBODY (ROUTINE TESTING W REFLEX): HIV 1&2 Ab, 4th Generation: NONREACTIVE

## 2022-04-30 LAB — URINE CULTURE, OB REFLEX: Organism ID, Bacteria: NO GROWTH

## 2022-04-30 LAB — CULTURE, OB URINE

## 2022-05-01 ENCOUNTER — Encounter: Payer: Self-pay | Admitting: Obstetrics and Gynecology

## 2022-05-08 LAB — PANORAMA PRENATAL TEST FULL PANEL:PANORAMA TEST PLUS 5 ADDITIONAL MICRODELETIONS: FETAL FRACTION: 7.7

## 2022-05-09 ENCOUNTER — Encounter: Payer: Self-pay | Admitting: Obstetrics and Gynecology

## 2022-05-27 ENCOUNTER — Ambulatory Visit (INDEPENDENT_AMBULATORY_CARE_PROVIDER_SITE_OTHER): Payer: BC Managed Care – PPO | Admitting: Obstetrics and Gynecology

## 2022-05-27 DIAGNOSIS — O0990 Supervision of high risk pregnancy, unspecified, unspecified trimester: Secondary | ICD-10-CM

## 2022-05-27 DIAGNOSIS — O165 Unspecified maternal hypertension, complicating the puerperium: Secondary | ICD-10-CM | POA: Insufficient documentation

## 2022-05-27 NOTE — Progress Notes (Signed)
   PRENATAL VISIT NOTE  Subjective:  Rebekah Hicks is a 36 y.o. G3P1101 at [redacted]w[redacted]d being seen today for ongoing prenatal care.  She is currently monitored for the following issues for this low-risk pregnancy and has Hyperlipidemia, mixed; Dysplasia of cervix, high grade CIN 2; History of IUFD; Supervision of high risk pregnancy, antepartum; and Postpartum hypertension on their problem list.  Patient reports no complaints.  Contractions: Not present. Vag. Bleeding: None.  Movement: Absent. Denies leaking of fluid.   The following portions of the patient's history were reviewed and updated as appropriate: allergies, current medications, past family history, past medical history, past social history, past surgical history and problem list.   Objective:   Vitals:   05/27/22 1042  BP: 111/72  Pulse: 89  Weight: 191 lb (86.6 kg)    Fetal Status: Fetal Heart Rate (bpm): 147   Movement: Absent     General:  Alert, oriented and cooperative. Patient is in no acute distress.  Skin: Skin is warm and dry. No rash noted.   Cardiovascular: Normal heart rate noted  Respiratory: Normal respiratory effort, no problems with respiration noted  Abdomen: Soft, gravid, appropriate for gestational age.  Pain/Pressure: Absent     Pelvic: Cervical exam deferred        Extremities: Normal range of motion.  Edema: None  Mental Status: Normal mood and affect. Normal behavior. Normal judgment and thought content.   Assessment and Plan:  Pregnancy: G3P1101 at [redacted]w[redacted]d  1. Postpartum hypertension  With first pregnancy. Was on BP medications for a small period of time Start BASA and continue throughout pregnancy  2. Supervision of high risk pregnancy, antepartum  Doing well MFM visit coming up NIPs testing normal- Girl    Preterm labor symptoms and general obstetric precautions including but not limited to vaginal bleeding, contractions, leaking of fluid and fetal movement were reviewed in detail  with the patient. Please refer to After Visit Summary for other counseling recommendations.   Return in about 4 weeks (around 06/24/2022).  Future Appointments  Date Time Provider Department Center  06/19/2022  2:30 PM [facility] NURSE [facility]  06/19/2022  2:45 PM [facility] US4 [facility]  06/23/2022  3:50 PM [place], Fredrich Romans, MD [facility]    Venia Carbon, NP

## 2022-06-19 ENCOUNTER — Ambulatory Visit: Payer: BC Managed Care – PPO | Attending: Obstetrics and Gynecology

## 2022-06-19 ENCOUNTER — Ambulatory Visit: Payer: BC Managed Care – PPO | Admitting: *Deleted

## 2022-06-19 VITALS — BP 99/74 | HR 75

## 2022-06-19 DIAGNOSIS — Z8759 Personal history of other complications of pregnancy, childbirth and the puerperium: Secondary | ICD-10-CM | POA: Insufficient documentation

## 2022-06-19 DIAGNOSIS — O165 Unspecified maternal hypertension, complicating the puerperium: Secondary | ICD-10-CM | POA: Insufficient documentation

## 2022-06-19 DIAGNOSIS — O0990 Supervision of high risk pregnancy, unspecified, unspecified trimester: Secondary | ICD-10-CM | POA: Diagnosis not present

## 2022-06-19 DIAGNOSIS — O09529 Supervision of elderly multigravida, unspecified trimester: Secondary | ICD-10-CM | POA: Insufficient documentation

## 2022-06-20 ENCOUNTER — Other Ambulatory Visit: Payer: Self-pay | Admitting: *Deleted

## 2022-06-20 DIAGNOSIS — Z8759 Personal history of other complications of pregnancy, childbirth and the puerperium: Secondary | ICD-10-CM

## 2022-06-20 DIAGNOSIS — Z362 Encounter for other antenatal screening follow-up: Secondary | ICD-10-CM

## 2022-06-20 DIAGNOSIS — O09292 Supervision of pregnancy with other poor reproductive or obstetric history, second trimester: Secondary | ICD-10-CM

## 2022-06-23 ENCOUNTER — Ambulatory Visit (INDEPENDENT_AMBULATORY_CARE_PROVIDER_SITE_OTHER): Payer: BC Managed Care – PPO | Admitting: Obstetrics & Gynecology

## 2022-06-23 VITALS — BP 111/71 | HR 83 | Wt 202.0 lb

## 2022-06-23 DIAGNOSIS — O0992 Supervision of high risk pregnancy, unspecified, second trimester: Secondary | ICD-10-CM

## 2022-06-23 DIAGNOSIS — Z3A19 19 weeks gestation of pregnancy: Secondary | ICD-10-CM

## 2022-06-23 DIAGNOSIS — O09522 Supervision of elderly multigravida, second trimester: Secondary | ICD-10-CM

## 2022-06-23 DIAGNOSIS — O09529 Supervision of elderly multigravida, unspecified trimester: Secondary | ICD-10-CM

## 2022-06-23 DIAGNOSIS — O0990 Supervision of high risk pregnancy, unspecified, unspecified trimester: Secondary | ICD-10-CM

## 2022-06-23 NOTE — Progress Notes (Signed)
   PRENATAL VISIT NOTE  Subjective:  Rebekah Hicks is a 36 y.o. G3P1101 at [redacted]w[redacted]d being seen today for ongoing prenatal care.  She is currently monitored for the following issues for this low-risk pregnancy and has Hyperlipidemia, mixed; Dysplasia of cervix, high grade CIN 2; History of IUFD; Supervision of high risk pregnancy, antepartum; and Postpartum hypertension on their problem list.  Patient reports  mild heartburn .  Contractions: Not present. Vag. Bleeding: None.  Movement: Present. Denies leaking of fluid.   The following portions of the patient's history were reviewed and updated as appropriate: allergies, current medications, past family history, past medical history, past social history, past surgical history and problem list.   Objective:   Vitals:   06/23/22 1553  BP: 111/71  Pulse: 83  Weight: 202 lb (91.6 kg)    Fetal Status: Fetal Heart Rate (bpm): 147   Movement: Present     General:  Alert, oriented and cooperative. Patient is in no acute distress.  Skin: Skin is warm and dry. No rash noted.   Cardiovascular: Normal heart rate noted  Respiratory: Normal respiratory effort, no problems with respiration noted  Abdomen: Soft, gravid, appropriate for gestational age.  Pain/Pressure: Absent     Pelvic: Cervical exam deferred        Extremities: Normal range of motion.  Edema: None  Mental Status: Normal mood and affect. Normal behavior. Normal judgment and thought content.   Assessment and Plan:  Pregnancy: G3P1101 at [redacted]w[redacted]d 1. Supervision of high risk pregnancy, antepartum - Alpha fetoprotein, maternal -Has f.u [place] scheudled. -Flu shot  Preterm labor symptoms and general obstetric precautions including but not limited to vaginal bleeding, contractions, leaking of fluid and fetal movement were reviewed in detail with the patient. Please refer to After Visit Summary for other counseling recommendations.   No follow-ups on file.  Future Appointments  Date  Time Provider Department Center  07/16/2022  3:30 PM [facility] NURSE [facility]  07/16/2022  3:45 PM [facility] US5 [facility]    Elsie Lincoln, MD

## 2022-06-25 LAB — ALPHA FETOPROTEIN, MATERNAL
AFP MoM: 1.25
AFP, Serum: 57.4 ng/mL
Calc'd Gestational Age: 19.6 weeks
Maternal Wt: 200 [lb_av]
Risk for ONTD: 1
Twins-AFP: 1

## 2022-07-16 ENCOUNTER — Ambulatory Visit: Payer: BC Managed Care – PPO | Attending: Maternal & Fetal Medicine

## 2022-07-16 ENCOUNTER — Ambulatory Visit: Payer: BC Managed Care – PPO | Admitting: *Deleted

## 2022-07-16 VITALS — BP 118/73 | HR 70

## 2022-07-16 DIAGNOSIS — O0990 Supervision of high risk pregnancy, unspecified, unspecified trimester: Secondary | ICD-10-CM | POA: Insufficient documentation

## 2022-07-16 DIAGNOSIS — Z362 Encounter for other antenatal screening follow-up: Secondary | ICD-10-CM | POA: Diagnosis present

## 2022-07-16 DIAGNOSIS — O165 Unspecified maternal hypertension, complicating the puerperium: Secondary | ICD-10-CM

## 2022-07-16 DIAGNOSIS — E669 Obesity, unspecified: Secondary | ICD-10-CM | POA: Diagnosis not present

## 2022-07-16 DIAGNOSIS — Z8759 Personal history of other complications of pregnancy, childbirth and the puerperium: Secondary | ICD-10-CM | POA: Diagnosis present

## 2022-07-16 DIAGNOSIS — O09292 Supervision of pregnancy with other poor reproductive or obstetric history, second trimester: Secondary | ICD-10-CM | POA: Insufficient documentation

## 2022-07-16 DIAGNOSIS — Z3A22 22 weeks gestation of pregnancy: Secondary | ICD-10-CM

## 2022-07-16 DIAGNOSIS — O99212 Obesity complicating pregnancy, second trimester: Secondary | ICD-10-CM

## 2022-07-17 ENCOUNTER — Other Ambulatory Visit: Payer: Self-pay | Admitting: *Deleted

## 2022-07-17 DIAGNOSIS — Q969 Turner's syndrome, unspecified: Secondary | ICD-10-CM

## 2022-07-17 DIAGNOSIS — Z8759 Personal history of other complications of pregnancy, childbirth and the puerperium: Secondary | ICD-10-CM

## 2022-07-17 DIAGNOSIS — R638 Other symptoms and signs concerning food and fluid intake: Secondary | ICD-10-CM

## 2022-07-17 DIAGNOSIS — O99212 Obesity complicating pregnancy, second trimester: Secondary | ICD-10-CM

## 2022-07-28 ENCOUNTER — Ambulatory Visit (INDEPENDENT_AMBULATORY_CARE_PROVIDER_SITE_OTHER): Payer: BC Managed Care – PPO | Admitting: Obstetrics and Gynecology

## 2022-07-28 ENCOUNTER — Encounter: Payer: Self-pay | Admitting: Obstetrics and Gynecology

## 2022-07-28 VITALS — BP 110/71 | HR 76 | Wt 210.0 lb

## 2022-07-28 DIAGNOSIS — Z8759 Personal history of other complications of pregnancy, childbirth and the puerperium: Secondary | ICD-10-CM

## 2022-07-28 DIAGNOSIS — Z3A24 24 weeks gestation of pregnancy: Secondary | ICD-10-CM

## 2022-07-28 DIAGNOSIS — O0990 Supervision of high risk pregnancy, unspecified, unspecified trimester: Secondary | ICD-10-CM

## 2022-07-28 DIAGNOSIS — O165 Unspecified maternal hypertension, complicating the puerperium: Secondary | ICD-10-CM

## 2022-07-28 DIAGNOSIS — O0992 Supervision of high risk pregnancy, unspecified, second trimester: Secondary | ICD-10-CM

## 2022-07-28 NOTE — Progress Notes (Signed)
   PRENATAL VISIT NOTE  Subjective:  Rebekah Hicks is a 36 y.o. G3P1101 at [redacted]w[redacted]d being seen today for ongoing prenatal care.  She is currently monitored for the following issues for this high-risk pregnancy and has Hyperlipidemia, mixed; Dysplasia of cervix, high grade CIN 2; History of IUFD; Supervision of high risk pregnancy, antepartum; and Postpartum hypertension on their problem list.  Patient reports no complaints.  Contractions: Not present. Vag. Bleeding: None.  Movement: Present. Denies leaking of fluid.   The following portions of the patient's history were reviewed and updated as appropriate: allergies, current medications, past family history, past medical history, past social history, past surgical history and problem list.   Objective:   Vitals:   07/28/22 1548  BP: 110/71  Pulse: 76  Weight: 210 lb (95.3 kg)    Fetal Status: Fetal Heart Rate (bpm): 151   Movement: Present     General:  Alert, oriented and cooperative. Patient is in no acute distress.  Skin: Skin is warm and dry. No rash noted.   Cardiovascular: Normal heart rate noted  Respiratory: Normal respiratory effort, no problems with respiration noted  Abdomen: Soft, gravid, appropriate for gestational age.  Pain/Pressure: Absent     Pelvic: Cervical exam deferred        Extremities: Normal range of motion.  Edema: None  Mental Status: Normal mood and affect. Normal behavior. Normal judgment and thought content.   Assessment and Plan:  Pregnancy: G3P1101 at [redacted]w[redacted]d  1. History of IUFD F/u growth 28 weeks  2. Supervision of high risk pregnancy, antepartum  3. Postpartum hypertension Was on procardia for 1 month post partum  4. [redacted] weeks gestation of pregnancy   Preterm labor symptoms and general obstetric precautions including but not limited to vaginal bleeding, contractions, leaking of fluid and fetal movement were reviewed in detail with the patient. Please refer to After Visit Summary for  other counseling recommendations.   Return in about 4 weeks (around 08/25/2022).  Future Appointments  Date Time Provider [place]  08/27/2022  2:30 PM [facility] NURSE [facility]  08/27/2022  2:45 PM [facility] US5 [facility]    Sloan Leiter, MD

## 2022-08-19 IMAGING — US US MFM OB DETAIL+14 WK
1 series · 12 of 28 positions shown · non-contrast
Comparison: none

[Series 1: us mfm ob detail+14 wk · 63 acquisitions, 12 frames shown]
[im 3/63]
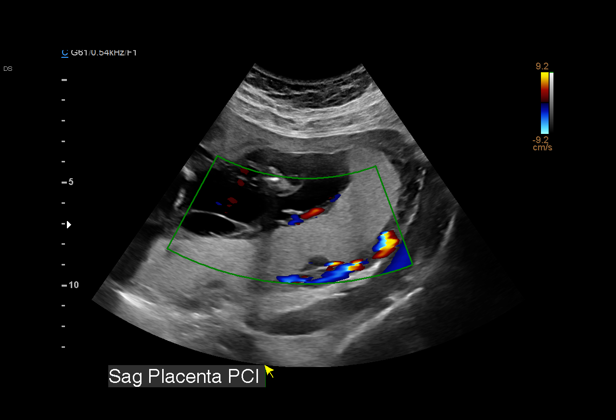
[im 7/63]
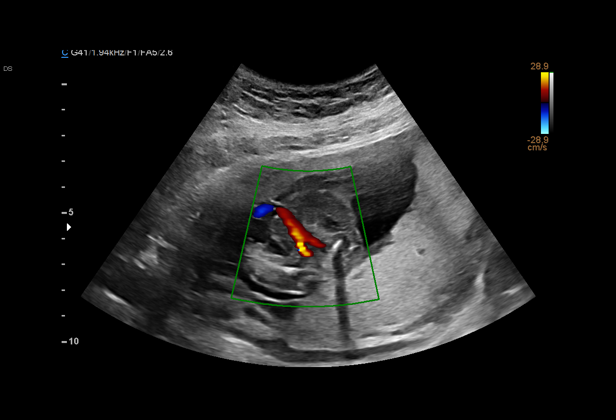
[im 12/63]
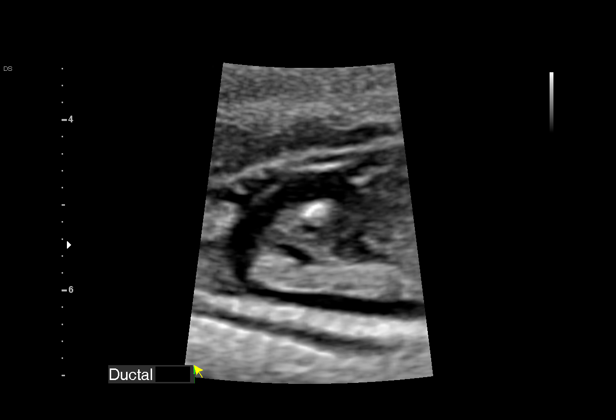
[im 19/63]
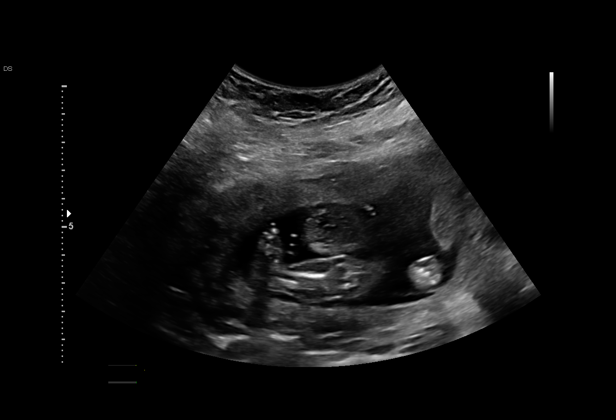
[im 23/63]
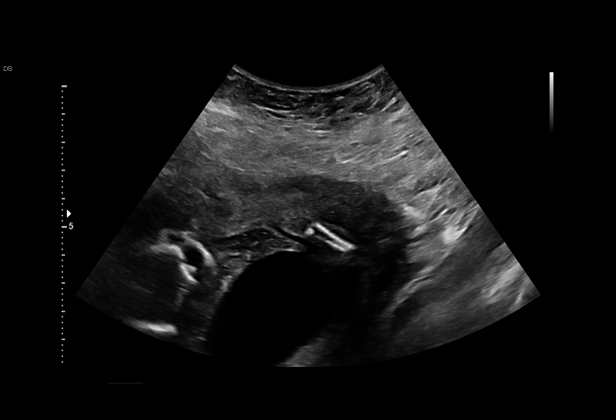
[im 28/63]
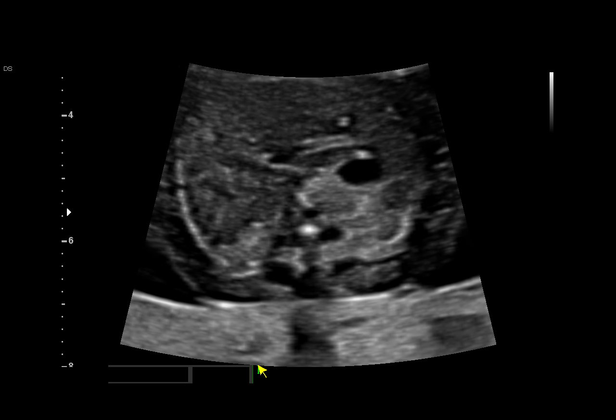
[im 35/63]
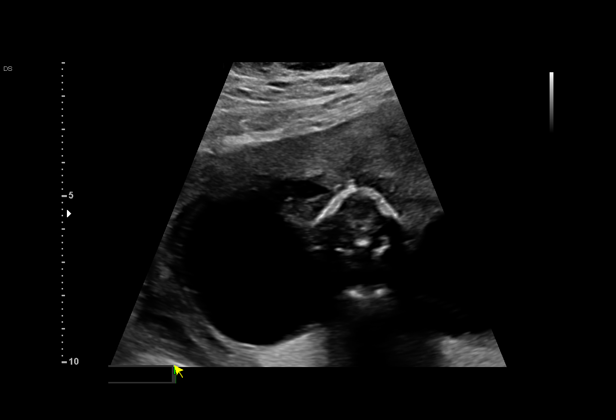
[im 40/63]
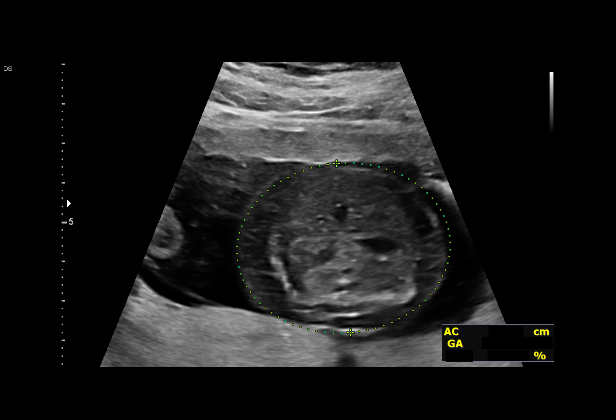
[im 44/63]
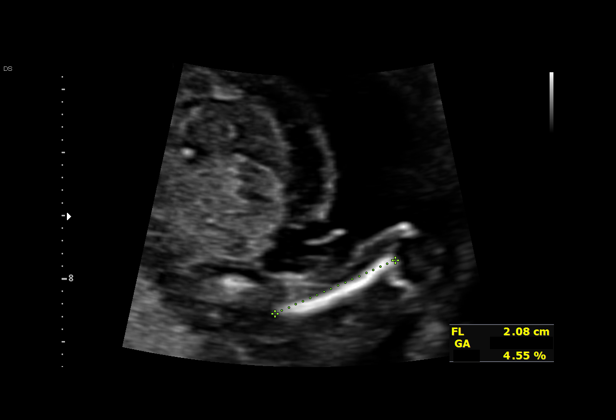
[im 51/63]
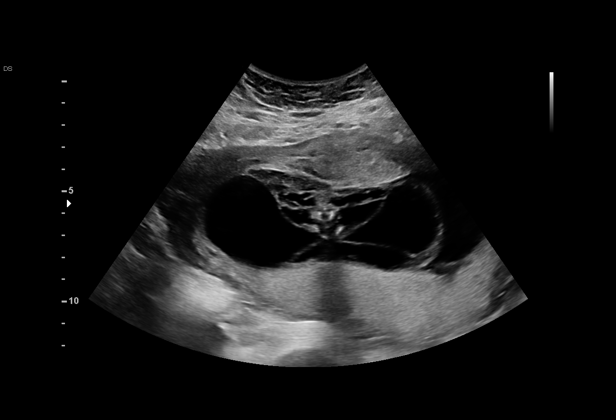
[im 56/63]
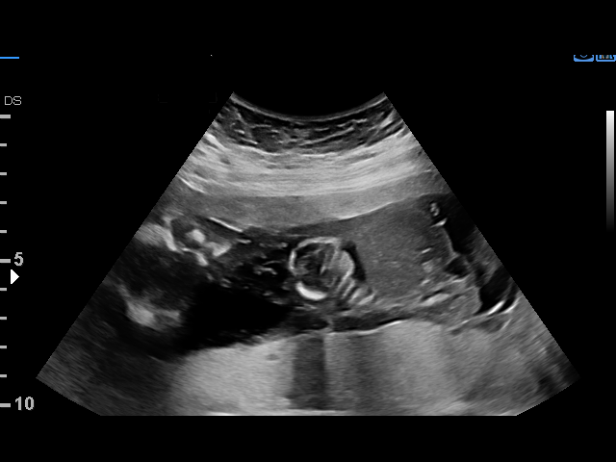
[im 60/63]
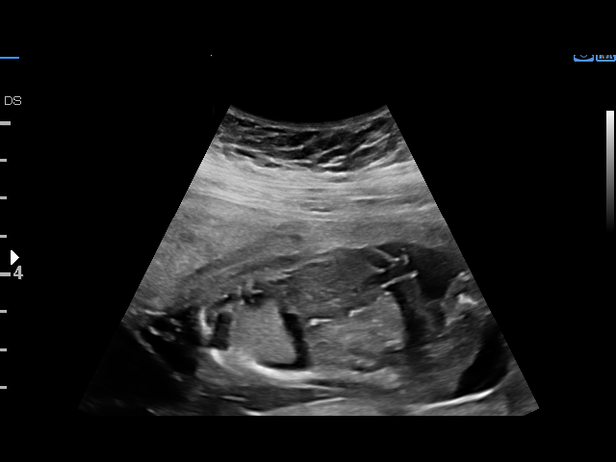

[12 of 28 positions shown; findings below may reference images not displayed]

Addendum:\.Gentille Deo

                                                      NAZARETH

Indications

 17 weeks gestation of pregnancy
 Abnormal biochemical screen (quad) for
 Monosomy X
 Abnormal ultrasound finding on antenatal
 screening of mother
 Fetal abnormality - other known or
 suspected (specify)
 Encounter for antenatal screening for
 malformations
Fetal Evaluation

 Num Of Fetuses:         1
 Cardiac Activity:       Observed
 Presentation:           Breech
 Placenta:               Posterior Previa
 P. Cord Insertion:      Visualized, central

 Amniotic Fluid
 AFI FV:      Within normal limits

                             Largest Pocket(cm)

Biometry
 BPD:      39.5  mm     G. Age:  18w 0d         64  %    CI:        89.77   %    70 - 86
                                                         FL/HC:      15.3   %    15.8 - 18
 HC:      131.1  mm     G. Age:  16w 5d          5  %    HC/AC:      0.85        1.07 -
 AC:      153.5  mm     G. Age:  20w 4d       > 99  %    FL/BPD:     50.6   %
 FL:         20  mm     G. Age:  16w 0d        2.5  %    FL/AC:      13.0   %    20 - 24
 HUM:      20.8  mm     G. Age:  16w 2d         12  %
 CER:      17.3  mm     G. Age:  17w 3d         36  %
 LV:        5.3  mm

  RIGHT
 ULN:      17.9  mm     G. Age:  16w 0d        < 5  %
 TIB:      15.8  mm     G. Age:  15w 4d        < 5  %
 RAD:        16  mm     G. Age:  15w 2d         21  %
 FIB:        16  mm     G. Age:  15w 2d         31  %
  LEFT
 HUM:      20.8  mm     G. Age:  16w 2d         12  %
 ULN:      17.9  mm     G. Age:  16w 0d        < 5  %
 TIB:      15.8  mm     G. Age:  15w 4d        < 5  %
 RAD:        16  mm     G. Age:  15w 2d         21  %
 FIB:        16  mm     G. Age:  15w 2d         31  %

 Est. FW:     230  gm      0 lb 8 oz     77  %
OB History

 Gravidity:    2         Term:   1
 Living:       1
Gestational Age

 LMP:           17w 5d        Date:  02/22/21                 EDD:   11/29/21
 U/S Today:     17w 6d                                        EDD:   11/28/21
 Best:          17w 5d     Det. By:  LMP  (02/22/21)          EDD:   11/29/21
Anatomy

 Cranium:               Abnormal, see          LVOT:                   Hypoplastic left
                        comments                                       heart
 Cavum:                 Appears normal         Aortic Arch:            Reversed flow
                                                                       across arch
 Ventricles:            Appears normal         Ductal Arch:            Appears normal
 Choroid Plexus:        Appears normal         Diaphragm:              Appears normal
 Cerebellum:            Appears normal         Stomach:                Appears normal, left
                                                                       sided
 Posterior Fossa:       Not well visualized    Abdomen:                Appears normal
 Nuchal Fold:           Cystic hygroma         Abdominal Wall:         Appears nml (cord
                                                                       insert, abd wall)
 Face:                  Appears normal         Cord Vessels:           Appears normal (3
                        (orbits and profile)                           vessel cord)
 Lips:                  Not well visualized    Kidneys:                Abnormal,
                                                                       Dysplastic
 Palate:                Appears normal         Bladder:                Absent fluid filled
                                                                       bladder
 Thoracic:              Pleural effusion       Spine:                  Appears normal
 Heart:                 Hypoplastic left       Upper Extremities:      Abnormal, see
                        heart TIGER                                      comments
 RVOT:                  Appears normal         Lower Extremities:      Abnormal, see
                                                                       comments
 Other:  Nasal bone visualized. Hands visualized. Feet visualized. Marked
         extremity and cranial edema. Cystic Hygrome with multiple atypial
         collections in the axilla and pelvis
Cervix Uterus Adnexa

 Cervix
 Length:           3.05  cm.
 Normal appearance by transabdominal scan.

 Right Ovary
 Within normal limits.

 Left Ovary
 Within normal limits.
Impression

 Ms. Danner is a G2P1 at 17 w 5 d here fora  follow up exam
 given known monsomy X, severe cystic hygroma and
 suspected cardiac defect.

 During her last visit the findings were reviewed and an option
 for amniocentesis were discussed and declined at that time.
 In addition, she was counseled regarding termination and
 desired to continue expectant management but understands
 that there is a high risk for fetal demise.

 She also met with our genetic counselor Pelipe Kozhikode (
 please see note in [REDACTED]).

 Today I reviewed today's findings including:
 Cystic hygroma
 Hypoplastic left heart with likely VSD
 The cranium appears abnormal with possible brachycephaly.
 The kidneys are echogenic and small, bladder was not
 visualized.

 Ms. Danner desires to continue the pregnancy. I recommend
 that she has FHT's assessed in office every 2-3 weeks. She
 is scheduled for a follow up growth in 4 weeks, however, after
 today's discussion we have scheduled her for growth in 8
 weeks and canceled the growth in 4 weeks. However, if she
 desires reassurance she or her providers may request a
 growth/limited exam in 4 weeks.

 She is aware that there is a high risk for fetal demise between
 today and the next examination.
Recommendations

 Follow up growth in 8 weeks.

*** End of Addendum ***\.Gentille Deo

                                                      NAZARETH

Indications

 17 weeks gestation of pregnancy
 Abnormal biochemical screen (quad) for
 Monosomy X
 Abnormal ultrasound finding on antenatal
 screening of mother
 Fetal abnormality - other known or
 suspected (specify)
 Encounter for antenatal screening for
 malformations
Fetal Evaluation

 Num Of Fetuses:         1
 Cardiac Activity:       Observed
 Presentation:           Breech
 Placenta:               Posterior Previa
 P. Cord Insertion:      Visualized, central

 Amniotic Fluid
 AFI FV:      Within normal limits

                             Largest Pocket(cm)

Biometry
 BPD:      39.5  mm     G. Age:  18w 0d         64  %    CI:        89.77   %    70 - 86
                                                         FL/HC:      15.3   %    15.8 - 18
 HC:      131.1  mm     G. Age:  16w 5d          5  %    HC/AC:      0.85        1.07 -
 AC:      153.5  mm     G. Age:  20w 4d       > 99  %    FL/BPD:     50.6   %
 FL:         20  mm     G. Age:  16w 0d        2.5  %    FL/AC:      13.0   %    20 - 24
 HUM:      20.8  mm     G. Age:  16w 2d         12  %
 CER:      17.3  mm     G. Age:  17w 3d         36  %
 LV:        5.3  mm

  RIGHT
 ULN:      17.9  mm     G. Age:  16w 0d        < 5  %
 TIB:      15.8  mm     G. Age:  15w 4d        < 5  %
 RAD:        16  mm     G. Age:  15w 2d         21  %
 FIB:        16  mm     G. Age:  15w 2d         31  %
  LEFT
 HUM:      20.8  mm     G. Age:  16w 2d         12  %
 ULN:      17.9  mm     G. Age:  16w 0d        < 5  %
 TIB:      15.8  mm     G. Age:  15w 4d        < 5  %
 RAD:        16  mm     G. Age:  15w 2d         21  %
 FIB:        16  mm     G. Age:  15w 2d         31  %

 Est. FW:     230  gm      0 lb 8 oz     77  %
OB History

 Gravidity:    2         Term:   1
 Living:       1
Gestational Age

 LMP:           17w 5d        Date:  02/22/21                 EDD:   11/29/21
 U/S Today:     17w 6d                                        EDD:   11/28/21
 Best:          17w 5d     Det. By:  LMP  (02/22/21)          EDD:   11/29/21
Anatomy

 Cranium:               Abnormal, see          LVOT:                   Hypoplastic left
                        comments                                       heart
 Cavum:                 Appears normal         Aortic Arch:            Reversed flow
                                                                       across arch
 Ventricles:            Appears normal         Ductal Arch:            Appears normal
 Choroid Plexus:        Appears normal         Diaphragm:              Appears normal
 Cerebellum:            Appears normal         Stomach:                Appears normal, left
                                                                       sided
 Posterior Fossa:       Not well visualized    Abdomen:                Appears normal
 Nuchal Fold:           Cystic hygroma         Abdominal Wall:         Appears nml (cord
                                                                       insert, abd wall)
 Face:                  Appears normal         Cord Vessels:           Appears normal (3
                        (orbits and profile)                           vessel cord)
 Lips:                  Not well visualized    Kidneys:                Abnormal,
                                                                       Dysplastic
 Palate:                Appears normal         Bladder:                Absent fluid filled
                                                                       bladder
 Thoracic:              Pleural effusion       Spine:                  Appears normal
 Heart:                 Hypoplastic left       Upper Extremities:      Abnormal, see
                        heart TIGER                                      comments
 RVOT:                  Appears normal         Lower Extremities:      Abnormal, see
                                                                       comments
 Other:  Nasal bone visualized. Hands visualized. Feet visualized. Marked
         extremity and cranial edema. Cystic Hygrome with multiple atypial
         collections in the axilla and pelvis
Cervix Uterus Adnexa

 Cervix
 Length:           3.05  cm.
 Normal appearance by transabdominal scan.

 Right Ovary
 Within normal limits.

 Left Ovary
 Within normal limits.
Impression

 Ms. Danner is a G2P1 at 17 w 5 d here fora  follow up exam
 given known monsomy X, severe cystic hygroma and
 suspected cardiac defect.

 During her last visit the findings were reviewed and an option
 for amniotcentesis were discussed and declined at that time.
 In addition, she was counseled regarding termination and
 desired to continue expectant management but understands
 that there is a high risk for fetal demise.

 She also met with our genetic counselor Pelipe Kozhikode (
 please see note in [REDACTED]).

 Today I reviewed today's findings including:
 Cystic hygroma
 Hypoplastic left heart with likely VSD
 The cranium appears abnormal with possible brachycephaly.
 The kidneys are echogenic and small, bladder was not
 visualized.

 Ms. Danner desires to continue the pregnancy. I recommend
 that she has FHT's assessed in office every 2-3 weeks. She
 is scheduled for a follow up growth in 4 weeks.

 She is aware that there is a high risk for fetal demise between
 today and the next examination.
Recommendations

 Follow up growth in 8 weeks.

## 2022-08-26 ENCOUNTER — Ambulatory Visit (INDEPENDENT_AMBULATORY_CARE_PROVIDER_SITE_OTHER): Payer: BC Managed Care – PPO | Admitting: Obstetrics and Gynecology

## 2022-08-26 VITALS — BP 109/76 | HR 90 | Wt 214.0 lb

## 2022-08-26 DIAGNOSIS — O0993 Supervision of high risk pregnancy, unspecified, third trimester: Secondary | ICD-10-CM | POA: Diagnosis not present

## 2022-08-26 DIAGNOSIS — Z23 Encounter for immunization: Secondary | ICD-10-CM | POA: Diagnosis not present

## 2022-08-26 DIAGNOSIS — O0990 Supervision of high risk pregnancy, unspecified, unspecified trimester: Secondary | ICD-10-CM

## 2022-08-26 DIAGNOSIS — Z3A28 28 weeks gestation of pregnancy: Secondary | ICD-10-CM

## 2022-08-26 NOTE — Progress Notes (Signed)
   PRENATAL VISIT NOTE  Subjective:  Rebekah Hicks is a 36 y.o. G3P1101 at [redacted]w[redacted]d being seen today for ongoing prenatal care.  She is currently monitored for the following issues for this low-risk pregnancy and has Hyperlipidemia, mixed; Dysplasia of cervix, high grade CIN 2; History of IUFD; Supervision of high risk pregnancy, antepartum; and Postpartum hypertension on their problem list.  Patient reports no complaints.  Contractions: Not present. Vag. Bleeding: None.  Movement: Present. Denies leaking of fluid.   The following portions of the patient's history were reviewed and updated as appropriate: allergies, current medications, past family history, past medical history, past social history, past surgical history and problem list.   Objective:   Vitals:   08/26/22 0844  BP: 109/76  Pulse: 90  Weight: 214 lb (97.1 kg)    Fetal Status: Fetal Heart Rate (bpm): 148 Fundal Height: 30 cm Movement: Present     General:  Alert, oriented and cooperative. Patient is in no acute distress.  Skin: Skin is warm and dry. No rash noted.   Cardiovascular: Normal heart rate noted  Respiratory: Normal respiratory effort, no problems with respiration noted  Abdomen: Soft, gravid, appropriate for gestational age.  Pain/Pressure: Absent     Pelvic: Cervical exam deferred        Extremities: Normal range of motion.  Edema: None  Mental Status: Normal mood and affect. Normal behavior. Normal judgment and thought content.   Assessment and Plan:  Pregnancy: G3P1101 at [redacted]w[redacted]d 1. Supervision of high risk pregnancy, antepartum  - 2Hr GTT w/ 1 Hr Carpenter 75 g - CBC - HIV antibody (with reflex) - RPR - Tdap vaccine greater than or equal to 7yo IM  -ACOG recommends the Pfizer RSV vaccine if you are 32 to [redacted] weeks pregnant from September to January. The vaccine creates antibodies that pass to your fetus. This means the baby will have some antibodies to protect them from RSV for the first 6  months after birth. She will get this done through pharmacy or PCP.   Preterm labor symptoms and general obstetric precautions including but not limited to vaginal bleeding, contractions, leaking of fluid and fetal movement were reviewed in detail with the patient. Please refer to After Visit Summary for other counseling recommendations.   No follow-ups on file.  Future Appointments  Date Time Provider Department Center  08/27/2022  2:30 PM [facility] NURSE [facility]  08/27/2022  2:45 PM [facility] US5 [facility]  09/09/2022  4:10 PM Jesiah Yerby, Harolyn Rutherford, NP [facility]    Venia Carbon, NP

## 2022-08-27 ENCOUNTER — Ambulatory Visit: Payer: BC Managed Care – PPO | Attending: Maternal & Fetal Medicine

## 2022-08-27 ENCOUNTER — Ambulatory Visit: Payer: BC Managed Care – PPO | Admitting: *Deleted

## 2022-08-27 VITALS — BP 117/67 | HR 87

## 2022-08-27 DIAGNOSIS — O165 Unspecified maternal hypertension, complicating the puerperium: Secondary | ICD-10-CM | POA: Insufficient documentation

## 2022-08-27 DIAGNOSIS — O99213 Obesity complicating pregnancy, third trimester: Secondary | ICD-10-CM | POA: Diagnosis not present

## 2022-08-27 DIAGNOSIS — O99212 Obesity complicating pregnancy, second trimester: Secondary | ICD-10-CM | POA: Diagnosis present

## 2022-08-27 DIAGNOSIS — R638 Other symptoms and signs concerning food and fluid intake: Secondary | ICD-10-CM | POA: Diagnosis present

## 2022-08-27 DIAGNOSIS — Z3A28 28 weeks gestation of pregnancy: Secondary | ICD-10-CM

## 2022-08-27 DIAGNOSIS — O0990 Supervision of high risk pregnancy, unspecified, unspecified trimester: Secondary | ICD-10-CM | POA: Insufficient documentation

## 2022-08-27 DIAGNOSIS — Z362 Encounter for other antenatal screening follow-up: Secondary | ICD-10-CM

## 2022-08-27 DIAGNOSIS — Z8759 Personal history of other complications of pregnancy, childbirth and the puerperium: Secondary | ICD-10-CM | POA: Insufficient documentation

## 2022-08-27 DIAGNOSIS — Q969 Turner's syndrome, unspecified: Secondary | ICD-10-CM | POA: Diagnosis present

## 2022-08-27 DIAGNOSIS — O09293 Supervision of pregnancy with other poor reproductive or obstetric history, third trimester: Secondary | ICD-10-CM | POA: Diagnosis not present

## 2022-08-27 DIAGNOSIS — E669 Obesity, unspecified: Secondary | ICD-10-CM | POA: Diagnosis not present

## 2022-08-27 LAB — CBC
HCT: 34.7 % — ABNORMAL LOW (ref 35.0–45.0)
Hemoglobin: 12 g/dL (ref 11.7–15.5)
MCH: 31.2 pg (ref 27.0–33.0)
MCHC: 34.6 g/dL (ref 32.0–36.0)
MCV: 90.1 fL (ref 80.0–100.0)
MPV: 10.7 fL (ref 7.5–12.5)
Platelets: 240 10*3/uL (ref 140–400)
RBC: 3.85 10*6/uL (ref 3.80–5.10)
RDW: 12.5 % (ref 11.0–15.0)
WBC: 9.7 10*3/uL (ref 3.8–10.8)

## 2022-08-27 LAB — HIV ANTIBODY (ROUTINE TESTING W REFLEX): HIV 1&2 Ab, 4th Generation: NONREACTIVE

## 2022-08-27 LAB — 2HR GTT W 1 HR, CARPENTER, 75 G
Glucose, 1 Hr, Gest: 120 mg/dL (ref 65–179)
Glucose, 2 Hr, Gest: 84 mg/dL (ref 65–152)
Glucose, Fasting, Gest: 72 mg/dL (ref 65–91)

## 2022-08-27 LAB — RPR: RPR Ser Ql: NONREACTIVE

## 2022-08-28 ENCOUNTER — Other Ambulatory Visit: Payer: Self-pay | Admitting: *Deleted

## 2022-08-28 DIAGNOSIS — Z3689 Encounter for other specified antenatal screening: Secondary | ICD-10-CM

## 2022-08-28 DIAGNOSIS — Z8759 Personal history of other complications of pregnancy, childbirth and the puerperium: Secondary | ICD-10-CM

## 2022-09-09 ENCOUNTER — Ambulatory Visit (INDEPENDENT_AMBULATORY_CARE_PROVIDER_SITE_OTHER): Payer: BC Managed Care – PPO | Admitting: Obstetrics and Gynecology

## 2022-09-09 VITALS — BP 121/78 | HR 96 | Wt 214.0 lb

## 2022-09-09 DIAGNOSIS — O0993 Supervision of high risk pregnancy, unspecified, third trimester: Secondary | ICD-10-CM

## 2022-09-09 DIAGNOSIS — O0990 Supervision of high risk pregnancy, unspecified, unspecified trimester: Secondary | ICD-10-CM

## 2022-09-09 DIAGNOSIS — Z8759 Personal history of other complications of pregnancy, childbirth and the puerperium: Secondary | ICD-10-CM

## 2022-09-09 DIAGNOSIS — Z3A30 30 weeks gestation of pregnancy: Secondary | ICD-10-CM

## 2022-09-09 NOTE — Progress Notes (Signed)
   PRENATAL VISIT NOTE  Subjective:  Rebekah Hicks is a 36 y.o. G3P1101 at [redacted]w[redacted]d being seen today for ongoing prenatal care.  She is currently monitored for the following issues for this high-risk pregnancy and has Hyperlipidemia, mixed; Dysplasia of cervix, high grade CIN 2; History of IUFD; Supervision of high risk pregnancy, antepartum; and Postpartum hypertension on their problem list.  Patient reports no complaints.  Contractions: Not present. Vag. Bleeding: None.  Movement: Present. Denies leaking of fluid.   The following portions of the patient's history were reviewed and updated as appropriate: allergies, current medications, past family history, past medical history, past social history, past surgical history and problem list.   Objective:   Vitals:   09/09/22 1609  BP: 121/78  Pulse: 96  Weight: 214 lb (97.1 kg)    Fetal Status: Fetal Heart Rate (bpm): 143 Fundal Height: 32 cm Movement: Present     General:  Alert, oriented and cooperative. Patient is in no acute distress.  Skin: Skin is warm and dry. No rash noted.   Cardiovascular: Normal heart rate noted  Respiratory: Normal respiratory effort, no problems with respiration noted  Abdomen: Soft, gravid, appropriate for gestational age.  Pain/Pressure: Absent     Pelvic: Cervical exam deferred        Extremities: Normal range of motion.  Edema: None  Mental Status: Normal mood and affect. Normal behavior. Normal judgment and thought content.   Assessment and Plan:  Pregnancy: G3P1101 at [redacted]w[redacted]d 1. Supervision of high risk pregnancy, antepartum  Doing well. Has noticed BP is up more than her usual. Still WNL Continue Home BP monitoring  2. History of IUFD  Briefly discussed delivery timing. Would like MFM input on delivery timing; 37 weeks? Patient would prefer 37 week delivery given her history.  Keep upcoming MFM appointments.   Preterm labor symptoms and general obstetric precautions including but not  limited to vaginal bleeding, contractions, leaking of fluid and fetal movement were reviewed in detail with the patient. Please refer to After Visit Summary for other counseling recommendations.   No follow-ups on file.  Future Appointments  Date Time Provider Department Center  09/24/2022  2:30 PM [facility] NURSE [facility]  09/24/2022  2:45 PM [facility] US6 [facility]  10/27/2022 10:45 AM [facility] NURSE [facility]  10/27/2022 11:00 AM [facility] US1 [facility]    Venia Carbon, NP

## 2022-09-22 ENCOUNTER — Encounter: Payer: BC Managed Care – PPO | Admitting: Obstetrics and Gynecology

## 2022-09-23 ENCOUNTER — Encounter: Payer: BC Managed Care – PPO | Admitting: Obstetrics and Gynecology

## 2022-09-24 ENCOUNTER — Ambulatory Visit: Payer: BC Managed Care – PPO | Admitting: *Deleted

## 2022-09-24 ENCOUNTER — Ambulatory Visit: Payer: BC Managed Care – PPO | Attending: Maternal & Fetal Medicine

## 2022-09-24 VITALS — BP 120/76 | HR 95

## 2022-09-24 DIAGNOSIS — O99213 Obesity complicating pregnancy, third trimester: Secondary | ICD-10-CM | POA: Diagnosis not present

## 2022-09-24 DIAGNOSIS — O165 Unspecified maternal hypertension, complicating the puerperium: Secondary | ICD-10-CM | POA: Insufficient documentation

## 2022-09-24 DIAGNOSIS — Z3A32 32 weeks gestation of pregnancy: Secondary | ICD-10-CM

## 2022-09-24 DIAGNOSIS — O0990 Supervision of high risk pregnancy, unspecified, unspecified trimester: Secondary | ICD-10-CM | POA: Diagnosis present

## 2022-09-24 DIAGNOSIS — O09293 Supervision of pregnancy with other poor reproductive or obstetric history, third trimester: Secondary | ICD-10-CM

## 2022-09-24 DIAGNOSIS — Z3689 Encounter for other specified antenatal screening: Secondary | ICD-10-CM | POA: Diagnosis present

## 2022-09-24 DIAGNOSIS — E669 Obesity, unspecified: Secondary | ICD-10-CM

## 2022-09-24 DIAGNOSIS — Z8759 Personal history of other complications of pregnancy, childbirth and the puerperium: Secondary | ICD-10-CM | POA: Diagnosis not present

## 2022-09-25 IMAGING — US US MFM OB LIMITED
1 series · 15 of 28 positions shown · non-contrast
Comparison: none

[Series 1: us mfm ob limited · 54 acquisitions, 15 frames shown]
[im 1/54]
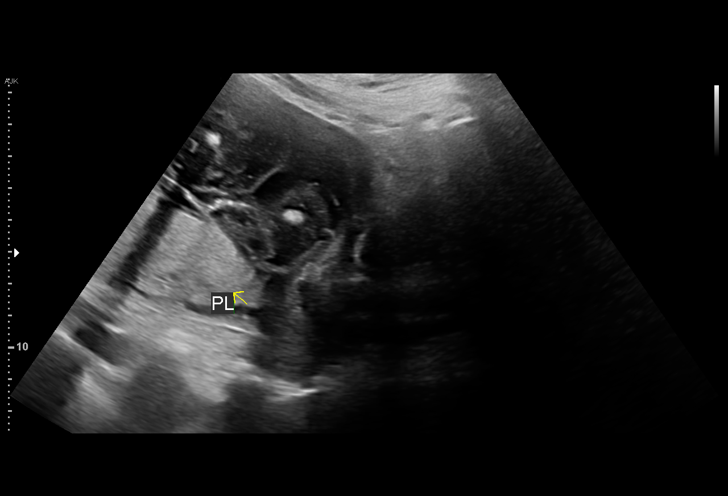
[im 4/54]
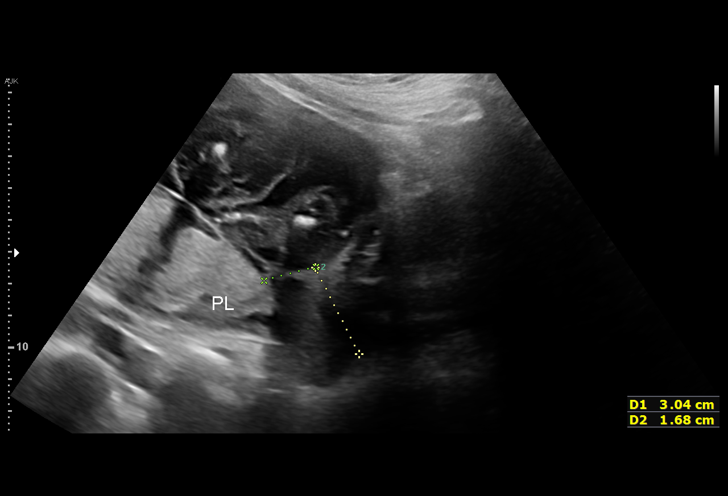
[im 8/54]
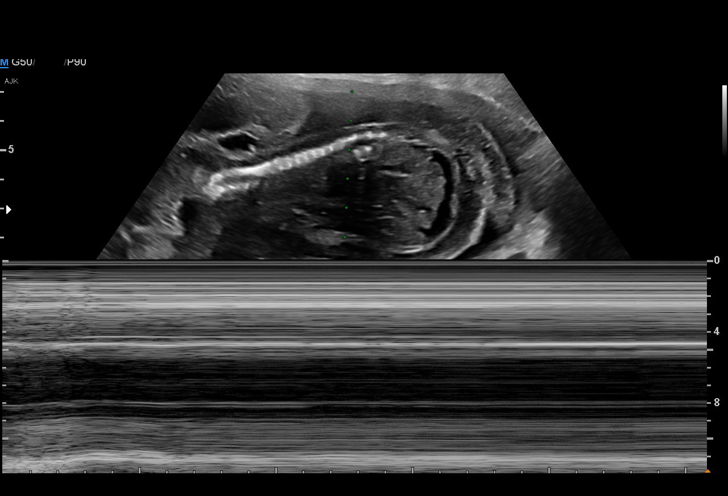
[im 12/54]
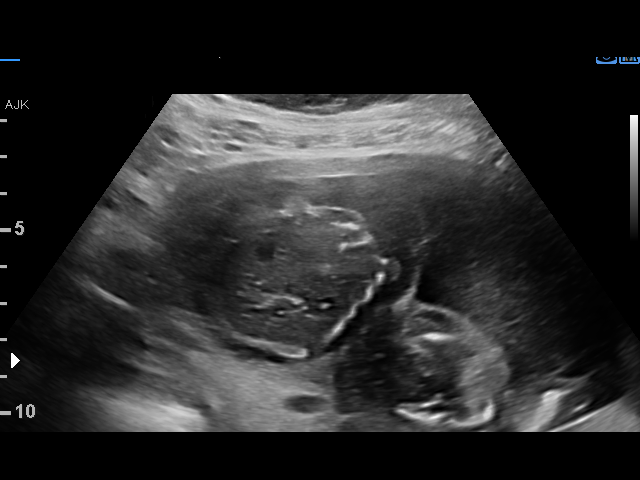
[im 16/54]
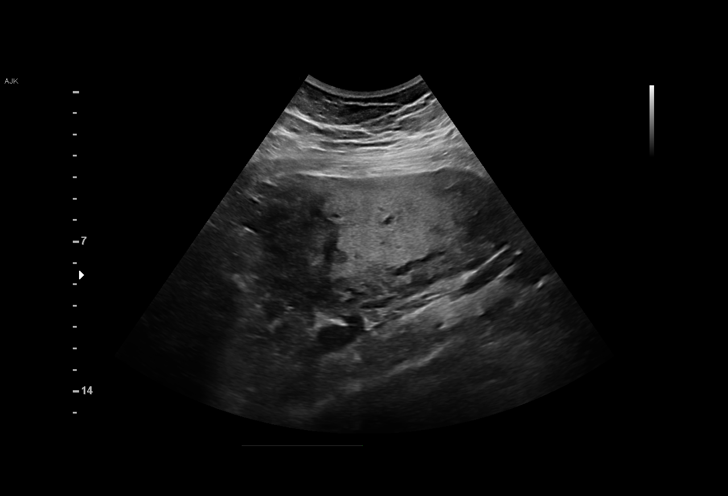
[im 20/54]
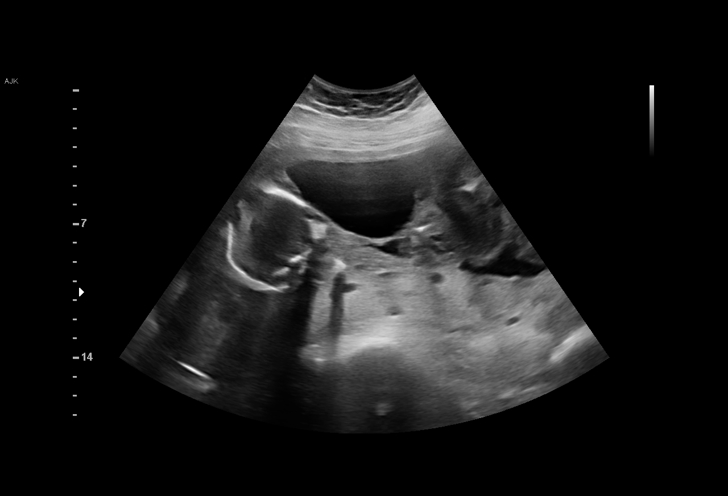
[im 24/54]
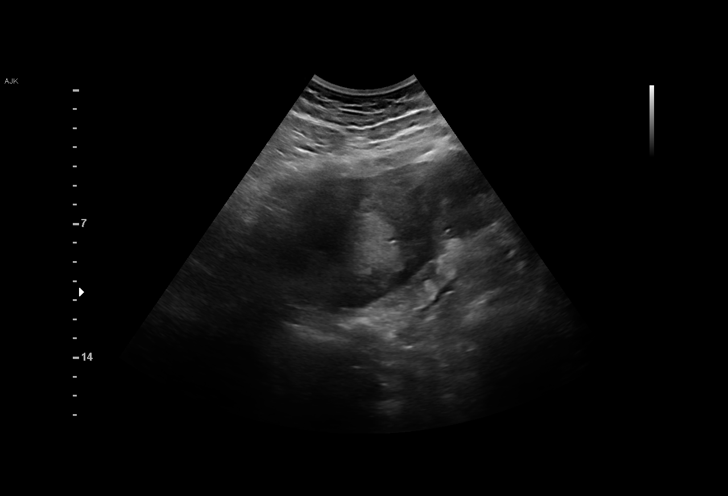
[im 28/54]
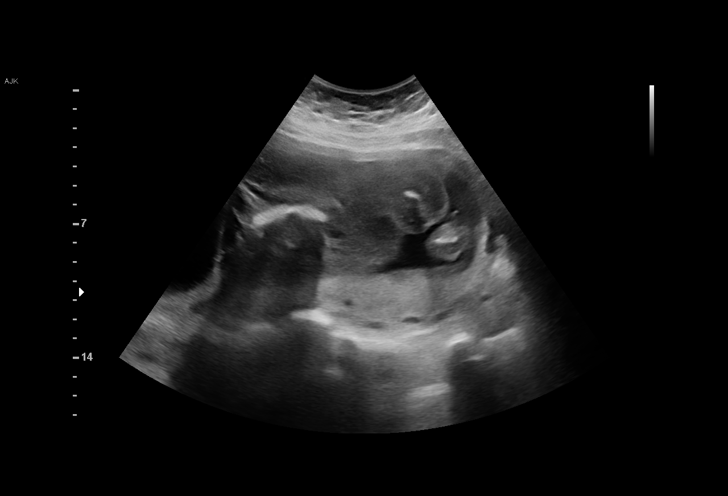
[im 30/54]
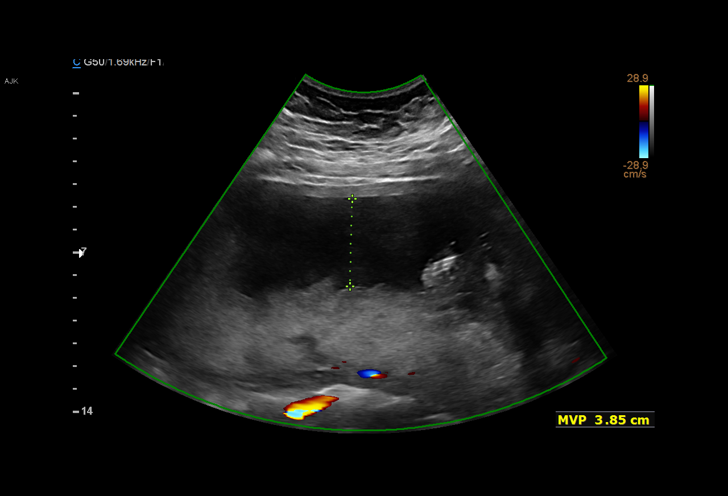
[im 34/54]
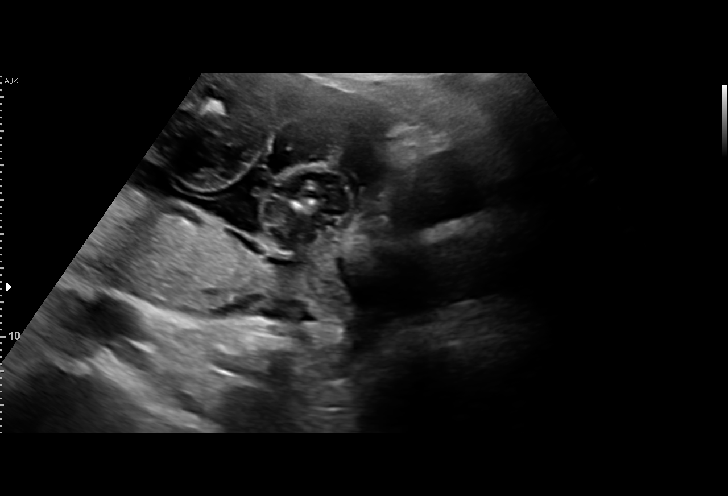
[im 38/54]
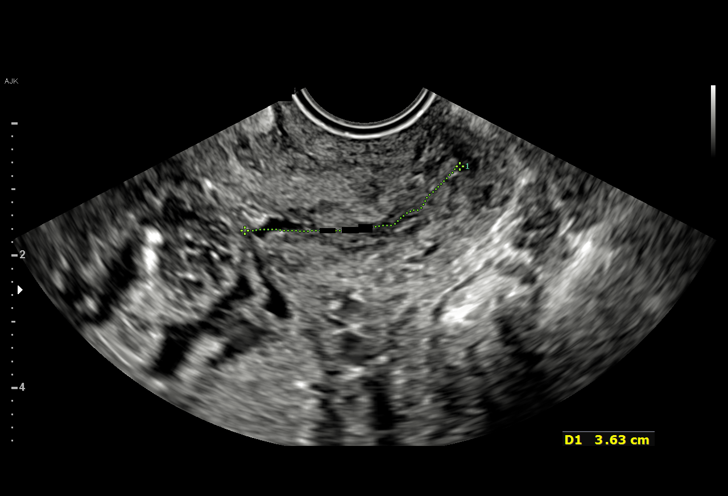
[im 42/54]
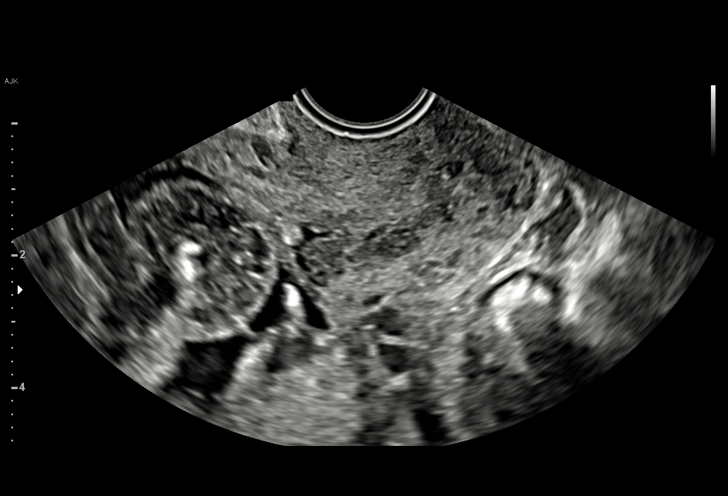
[im 46/54]
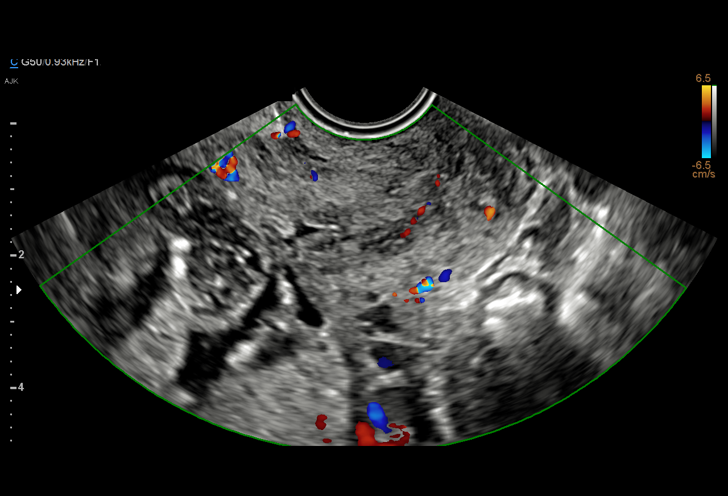
[im 50/54]
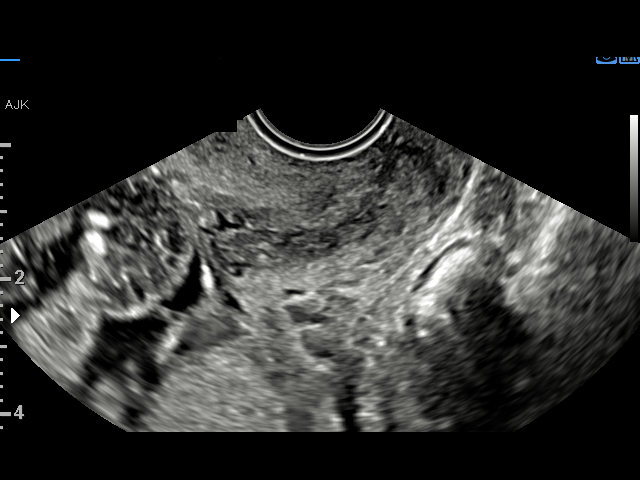
[im 54/54]
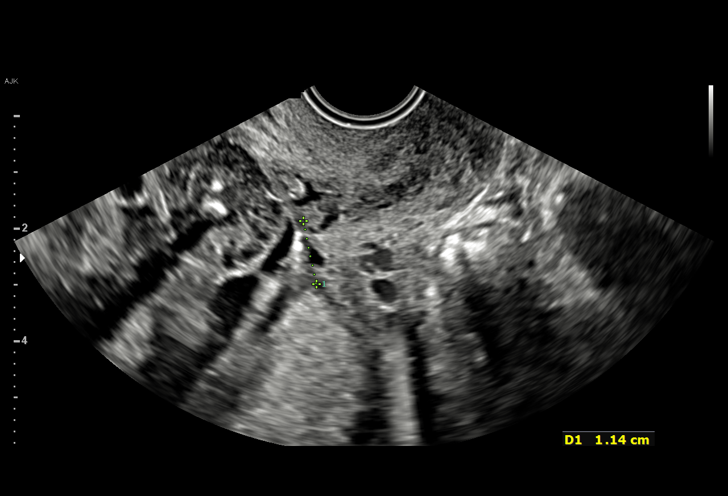

[15 of 28 positions shown; findings below may reference images not displayed]

Indications

 23 weeks gestation of pregnancy
 Fetal demise greater than 22 weeks
 Placenta previa specified as without
 hemorrhage, second trimester
 Abnormal biochemical screen (quad) for
 Monosomy X
 Abnormal ultrasound finding on antenatal
 screening of mother
 Fetal abnormality - other known or
 suspected (specify)
Fetal Evaluation

 Num Of Fetuses:         1
 Cardiac Activity:       Absent
 Presentation:           Breech
 Placenta:               Posterior, low-lying, 0.9 cm from int os

 Amniotic Fluid
 AFI FV:      Within normal limits

                             Largest Pocket(cm)

OB History

 Gravidity:    2         Term:   1
 Living:       1
Gestational Age

 LMP:           23w 0d        Date:  02/22/21                 EDD:   11/29/21
 Best:          23w 0d     Det. By:  LMP  (02/22/21)          EDD:   11/29/21
Cervix Uterus Adnexa

 Cervix
 Length:            3.6  cm.
 Normal appearance by transvaginal scan
Impression

 Limited exam to assess fetal heart
 The pregnancy is complicated with prior cystic hygroma with
 +NIPS for monosomy X
 Cardiac activity is absent today
 Low lying placent of 0.9 cm from the internal os is noted.
Recommendations

 Management per inpatient provider.

## 2022-10-13 NOTE — L&D Delivery Note (Cosign Needed)
OB/GYN Faculty Practice Delivery Note  Rebekah Hicks is a 37 y.o. G3P1101 s/p SVD at [redacted]w[redacted]d. She was admitted for IOL for prior IUFD.   ROM: 4h 22m with blood-tinged fluid GBS Status: Positive - treated with PCN   Maximum Maternal Temperature: 97.8  Labor Progress: Initial SVE: 2.5/50/-2. She then progressed to complete.   Delivery Date/Time: 11/10/2022 @ 1952 Delivery: Called to room and patient was complete and pushing. Head delivered ROA. No nuchal cord present. Shoulder and body delivered in usual fashion. Infant with spontaneous cry, placed on mother's abdomen, dried and stimulated. Cord clamped x 2 after 1-minute delay, and cut by FOB. Cord blood drawn. Placenta delivered spontaneously with gentle cord traction. Fundus firm with massage and Pitocin. Labia, perineum, vagina, and cervix inspected with no tears.  Baby Weight: pending  Placenta: 3 vessel, intact. Sent to L&D Complications: None Lacerations: None EBL: 213 mL Analgesia: Epidural  Infant:  APGAR (1 MIN): 8   APGAR (5 MINS): Hardy, DO 11/10/2022, 8:13 PM

## 2022-10-14 ENCOUNTER — Other Ambulatory Visit (HOSPITAL_COMMUNITY)
Admission: RE | Admit: 2022-10-14 | Discharge: 2022-10-14 | Disposition: A | Discharge status: Home / Self Care | Payer: BC Managed Care – PPO | Source: Ambulatory Visit | Attending: Certified Nurse Midwife | Admitting: Certified Nurse Midwife

## 2022-10-14 ENCOUNTER — Ambulatory Visit (INDEPENDENT_AMBULATORY_CARE_PROVIDER_SITE_OTHER): Payer: BC Managed Care – PPO | Admitting: Certified Nurse Midwife

## 2022-10-14 VITALS — BP 112/74 | HR 78 | Wt 231.0 lb

## 2022-10-14 DIAGNOSIS — Z3A35 35 weeks gestation of pregnancy: Secondary | ICD-10-CM

## 2022-10-14 DIAGNOSIS — O0993 Supervision of high risk pregnancy, unspecified, third trimester: Secondary | ICD-10-CM

## 2022-10-14 DIAGNOSIS — O0990 Supervision of high risk pregnancy, unspecified, unspecified trimester: Secondary | ICD-10-CM | POA: Insufficient documentation

## 2022-10-14 LAB — OB RESULTS CONSOLE GBS: GBS: POSITIVE

## 2022-10-14 NOTE — Progress Notes (Signed)
Subjective:  Rebekah Hicks is a 38 y.o. G3P1101 at [redacted]w[redacted]d being seen today for ongoing prenatal care.  She is currently monitored for the following issues for this low-risk pregnancy and has Hyperlipidemia, mixed; Dysplasia of cervix, high grade CIN 2; History of IUFD; Supervision of high risk pregnancy, antepartum; and Postpartum hypertension on their problem list.  Patient reports no complaints.  Contractions: Not present. Vag. Bleeding: None.  Movement: Present. Denies leaking of fluid.   The following portions of the patient's history were reviewed and updated as appropriate: allergies, current medications, past family history, past medical history, past social history, past surgical history and problem list. Problem list updated.  Objective:   Vitals:   10/14/22 1315  BP: 112/74  Pulse: 78  Weight: 231 lb (104.8 kg)    Fetal Status: Fetal Heart Rate (bpm): 146 Fundal Height: 36 cm Movement: Present  Presentation: Vertex  General:  Alert, oriented and cooperative. Patient is in no acute distress.  Skin: Skin is warm and dry. No rash noted.   Cardiovascular: Normal heart rate noted  Respiratory: Normal respiratory effort, no problems with respiration noted  Abdomen: Soft, gravid, appropriate for gestational age. Pain/Pressure: Absent     Pelvic: Vag. Bleeding: None     Cervical exam deferred        Extremities: Normal range of motion.  Edema: None  Mental Status: Normal mood and affect. Normal behavior. Normal judgment and thought content.   Urinalysis:      Assessment and Plan:  Pregnancy: G3P1101 at [redacted]w[redacted]d  1. Supervision of high risk pregnancy, antepartum - Culture, beta strep (group b only) - Cervicovaginal ancillary only( [place])  2. [redacted] weeks gestation of pregnancy   3. Hx IUFD - IOL scheduled for 11/10/22 per pt request  Preterm labor symptoms and general obstetric precautions including but not limited to vaginal bleeding, contractions, leaking of fluid  and fetal movement were reviewed in detail with the patient. Please refer to After Visit Summary for other counseling recommendations.  Return in about 1 week (around 10/21/2022).   Julianne Handler, CNM

## 2022-10-15 LAB — CERVICOVAGINAL ANCILLARY ONLY
Chlamydia: NEGATIVE
Comment: NEGATIVE
Comment: NORMAL
Neisseria Gonorrhea: NEGATIVE

## 2022-10-17 ENCOUNTER — Encounter: Payer: Self-pay | Admitting: Certified Nurse Midwife

## 2022-10-17 DIAGNOSIS — O99820 Streptococcus B carrier state complicating pregnancy: Secondary | ICD-10-CM | POA: Insufficient documentation

## 2022-10-17 LAB — CULTURE, BETA STREP (GROUP B ONLY)
MICRO NUMBER:: 14381672
SPECIMEN QUALITY:: ADEQUATE

## 2022-10-22 ENCOUNTER — Encounter: Payer: Self-pay | Admitting: Obstetrics and Gynecology

## 2022-10-22 ENCOUNTER — Ambulatory Visit (INDEPENDENT_AMBULATORY_CARE_PROVIDER_SITE_OTHER): Payer: BC Managed Care – PPO | Admitting: Obstetrics and Gynecology

## 2022-10-22 VITALS — BP 118/77 | HR 88 | Wt 236.0 lb

## 2022-10-22 DIAGNOSIS — O0993 Supervision of high risk pregnancy, unspecified, third trimester: Secondary | ICD-10-CM

## 2022-10-22 DIAGNOSIS — O0990 Supervision of high risk pregnancy, unspecified, unspecified trimester: Secondary | ICD-10-CM

## 2022-10-22 DIAGNOSIS — Z3A36 36 weeks gestation of pregnancy: Secondary | ICD-10-CM

## 2022-10-22 DIAGNOSIS — Z8759 Personal history of other complications of pregnancy, childbirth and the puerperium: Secondary | ICD-10-CM

## 2022-10-22 DIAGNOSIS — O99820 Streptococcus B carrier state complicating pregnancy: Secondary | ICD-10-CM

## 2022-10-22 NOTE — Progress Notes (Signed)
PRENATAL VISIT NOTE  Subjective:  Rebekah Hicks is a 37 y.o. G3P1101 at [redacted]w[redacted]d being seen today for ongoing prenatal care.  She is currently monitored for the following issues for this high-risk pregnancy and has Hyperlipidemia, mixed; Dysplasia of cervix, high grade CIN 2; History of IUFD; Supervision of high risk pregnancy, antepartum; Postpartum hypertension; and GBS (group B Streptococcus carrier), +RV culture, currently pregnant on their problem list.  Patient reports no complaints.  Contractions: Irritability. Vag. Bleeding: None.  Movement: Present. Denies leaking of fluid.   The following portions of the patient's history were reviewed and updated as appropriate: allergies, current medications, past family history, past medical history, past social history, past surgical history and problem list.   Objective:   Vitals:   10/22/22 1537  BP: 118/77  Pulse: 88  Weight: 236 lb (107 kg)    Fetal Status: Fetal Heart Rate (bpm): 139 Fundal Height: 37 cm Movement: Present     General:  Alert, oriented and cooperative. Patient is in no acute distress.  Skin: Skin is warm and dry. No rash noted.   Cardiovascular: Normal heart rate noted  Respiratory: Normal respiratory effort, no problems with respiration noted  Abdomen: Soft, gravid, appropriate for gestational age.  Pain/Pressure: Present     Pelvic: Cervical exam deferred        Extremities: Normal range of motion.  Edema: Trace  Mental Status: Normal mood and affect. Normal behavior. Normal judgment and thought content.  [place] MFM OB FOLLOW UP  Result Date: 09/24/2022 ----------------------------------------------------------------------  OBSTETRICS REPORT                       (Signed Final 09/24/2022 03:22 pm) ---------------------------------------------------------------------- Patient Info  ID #:       [id number]                          D.O.B.:  07-Aug-1986 (36 yrs)  Name:       Rebekah Hicks [facility]           Visit Date:  09/24/2022 02:53 pm ---------------------------------------------------------------------- Performed By  Attending:        Ma Rings MD         Ref. Address:     [address] [place]  Performed By:     Joanna Hews         [facility]  Referred By:      Willene Hatchet ---------------------------------------------------------------------- Orders  #  Description                           Code        Ordered By  1  [place] MFM OB FOLLOW UP                   16109.60    Valeda Malm ----------------------------------------------------------------------  #  Order #                     Accession #                Episode #  1  [id number]                   [id number]                 [id number] ---------------------------------------------------------------------- Indications  Previous pregnancy complicated by              O09.299  chromosomal abnormality, antepartum  (Turner Sydrome)  Poor obstetric history: Previous IUFD          O09.299  (stillbirth) (23wks)  [redacted] weeks gestation of pregnancy                N6E.95  Obesity complicating pregnancy, third          O99.213  trimester (BMI: 41)  Encounter for other antenatal screening        Z36.2  follow-up  LR NIPS - Female ---------------------------------------------------------------------- Vital Signs  BP:          120/76 ---------------------------------------------------------------------- Fetal Evaluation  Num Of Fetuses:         1  Fetal Heart Rate(bpm):  145  Cardiac Activity:       Observed  Presentation:           Cephalic  Placenta:               Anterior  P. Cord Insertion:      Previously Visualized  Amniotic Fluid  AFI FV:      Within normal limits  AFI  Sum(cm)     %Tile       Largest Pocket(cm)  10.4            21          4.  RUQ(cm)       RLQ(cm)       LUQ(cm)        LLQ(cm)  0             4             2.6            3.8 ---------------------------------------------------------------------- Biometry  BPD:      79.7  mm     G. Age:  32w 0d         20  %    CI:        69.68   %    70 -  86                                                          FL/HC:      20.9   %    19.9 - 21.5  HC:      304.7  mm     G. Age:  33w 6d         39  %    HC/AC:      1.04        0.96 - 1.11  AC:      291.6  mm     G. Age:  33w 1d         60  %    FL/BPD:     79.9   %    71 - 87  FL:       63.7  mm     G. Age:  32w 6d         40  %    FL/AC:      21.8   %    20 - 24  LV:        2.8  mm  Est. FW:    2109  gm    4 lb 10 oz      47  % ---------------------------------------------------------------------- OB History  Gravidity:    3         Term:   1        Prem:   1  Living:       1 ---------------------------------------------------------------------- Gestational Age  U/S Today:     33w 0d                                        EDD:   11/12/22  Best:          32w 6d     Det. By:  Marcella Dubs         EDD:   11/13/22                                      (04/28/22) ---------------------------------------------------------------------- Anatomy  Cranium:               Appears normal         Diaphragm:              Appears normal  Cavum:                 Appears normal         Stomach:                Appears normal, left                                                                        sided  Ventricles:  Appears normal         Kidneys:                Appear normal  Heart:                 Appears normal         Bladder:                Appears normal                         (4CH, axis, and                         situs)  Other:  VC, 3VV and 3VTV visualized. ---------------------------------------------------------------------- Cervix Uterus Adnexa  Cervix  Not visualized  (advanced GA >24wks)  Uterus  No abnormality visualized.  Right Ovary  Not visualized.  Left Ovary  Not visualized. ---------------------------------------------------------------------- Comments  This patient was seen for a follow up growth scan due to a  prior IUFD at 23 weeks due to a fetus with confirmed Turner  syndrome.  She denies any problems since her last exam.  She was informed that the fetal growth and amniotic fluid  level appears appropriate for her gestational age.  The patient inquired regarding the timing of delivery.  She  was advised that due to her prior IUFD, delivery may be  considered at around 70 weeks.  The patient stated that she would prefer to await  spontaneous labor or until 39 weeks.  She was reassured  that as her cell free DNA test indicated a low risk for trisomy  32, 48, and 13 and sex chromosome abnormalities and as  the fetal growth is within normal limits, the overall risk of  another IUFD should be low.  She will return in 4 weeks for another growth scan. ----------------------------------------------------------------------                   Johnell Comings, MD Electronically Signed Final Report   09/24/2022 03:22 pm ----------------------------------------------------------------------   Assessment and Plan:  Pregnancy: [id number] at [redacted]w[redacted]d 1. Supervision of high risk pregnancy, antepartum Patient is doing well without complaints Patient has baby shower planned for this weekend  2. GBS (group B Streptococcus carrier), +RV culture, currently pregnant Prophylaxis in labor Reviewed results with the patient  3. 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 Please refer to After Visit Summary for other counseling  recommendations.   Return in about 1 week (around 10/29/2022) for in person, ROB, Low risk.  Future Appointments  Date Time Provider [place]  10/27/2022  8:50 AM Janyth Pupa, DO [facility]  10/27/2022 10:45 AM [facility] NURSE [facility]  10/27/2022 11:00 AM [facility] US1 [facility]  11/10/2022  6:30 AM [facility] [place] [facility] None    Mora Bellman, MD

## 2022-10-27 ENCOUNTER — Ambulatory Visit (INDEPENDENT_AMBULATORY_CARE_PROVIDER_SITE_OTHER): Payer: BC Managed Care – PPO | Admitting: Obstetrics & Gynecology

## 2022-10-27 ENCOUNTER — Encounter: Payer: Self-pay | Admitting: Obstetrics & Gynecology

## 2022-10-27 ENCOUNTER — Ambulatory Visit: Payer: BC Managed Care – PPO | Admitting: *Deleted

## 2022-10-27 ENCOUNTER — Ambulatory Visit: Payer: BC Managed Care – PPO | Attending: Maternal & Fetal Medicine

## 2022-10-27 VITALS — BP 106/67 | HR 85

## 2022-10-27 VITALS — BP 114/80 | HR 93 | Wt 234.0 lb

## 2022-10-27 DIAGNOSIS — O09293 Supervision of pregnancy with other poor reproductive or obstetric history, third trimester: Secondary | ICD-10-CM | POA: Diagnosis not present

## 2022-10-27 DIAGNOSIS — O0990 Supervision of high risk pregnancy, unspecified, unspecified trimester: Secondary | ICD-10-CM

## 2022-10-27 DIAGNOSIS — Z3A37 37 weeks gestation of pregnancy: Secondary | ICD-10-CM

## 2022-10-27 DIAGNOSIS — Z3689 Encounter for other specified antenatal screening: Secondary | ICD-10-CM

## 2022-10-27 DIAGNOSIS — O99820 Streptococcus B carrier state complicating pregnancy: Secondary | ICD-10-CM

## 2022-10-27 DIAGNOSIS — O165 Unspecified maternal hypertension, complicating the puerperium: Secondary | ICD-10-CM | POA: Insufficient documentation

## 2022-10-27 DIAGNOSIS — O99213 Obesity complicating pregnancy, third trimester: Secondary | ICD-10-CM

## 2022-10-27 DIAGNOSIS — Z8759 Personal history of other complications of pregnancy, childbirth and the puerperium: Secondary | ICD-10-CM

## 2022-10-27 DIAGNOSIS — E669 Obesity, unspecified: Secondary | ICD-10-CM

## 2022-10-27 DIAGNOSIS — Z3483 Encounter for supervision of other normal pregnancy, third trimester: Secondary | ICD-10-CM

## 2022-10-27 NOTE — Progress Notes (Signed)
OB VISIT  LOW-RISK PREGNANCY VISIT Patient name: Rebekah Hicks MRN [id number]  Date of birth: 11-20-1985 Chief Complaint:   Routine Prenatal Visit  History of Present Illness:   Rebekah Hicks is a 37 y.o. G65P1101 female at [redacted]w[redacted]d with an Estimated Date of Delivery: 11/13/22 being seen today for ongoing management of a low-risk pregnancy.   -h/o IUFD @ 23wk with monosomy X     04/28/2022   10:39 AM 05/07/2021    1:55 PM 07/14/2018    3:18 PM  Depression screen PHQ 2/9  Decreased Interest 0 0 0  Down, Depressed, Hopeless 0 0 0  PHQ - 2 Score 0 0 0  Altered sleeping 1 0 0  Tired, decreased energy 0 0 0  Change in appetite 0 0 0  Feeling bad or failure about yourself  0 0 0  Trouble concentrating 0 0 0  Moving slowly or fidgety/restless 0 0 0  Suicidal thoughts 0 0 0  PHQ-9 Score 1 0 0  Difficult doing work/chores  Not difficult at all Not difficult at all    Today she reports no complaints. Contractions: Irritability. Vag. Bleeding: None.  Movement: Present. denies leaking of fluid. Review of Systems:   Pertinent items are noted in HPI Denies abnormal vaginal discharge w/ itching/odor/irritation, headaches, visual changes, shortness of breath, chest pain, abdominal pain, severe nausea/vomiting, or problems with urination or bowel movements unless otherwise stated above. Pertinent History Reviewed:  Reviewed past medical,surgical, social, obstetrical and family history.  Reviewed problem list, medications and allergies.  Physical Assessment:   Vitals:   10/27/22 0847  BP: 114/80  Pulse: 93  Weight: 234 lb (106.1 kg)  Body mass index is 37.77 kg/m.        Physical Examination:   General appearance: Well appearing, and in no distress  Mental status: Alert, oriented to person, place, and time  Skin: Warm & dry  Respiratory: Normal respiratory effort, no distress  Abdomen: Soft, gravid, nontender  Pelvic: Cervical exam deferred         Extremities: Edema:  None  Psych:  mood and affect appropriate  Fetal Status: Fetal Heart Rate (bpm): 145 Fundal Height: 36 cm Movement: Present    Chaperone: n/a    No results found for this or any previous visit (from the past 24 hour(s)).   Assessment & Plan:  1) Low-risk pregnancy G3P1101 at [redacted]w[redacted]d with an Estimated Date of Delivery: 11/13/22     Meds: No orders of the defined types were placed in this encounter.  Labs/procedures today: none  Plan:  Continue routine obstetrical care  Next visit: prefers in person    Reviewed: Term labor symptoms and general obstetric precautions including but not limited to vaginal bleeding, contractions, leaking of fluid and fetal movement were reviewed in detail with the patient.  All questions were answered. Pt has home bp cuff. Check bp weekly, let us know if >140/90.   Follow-up: Return for 1wk as scheduled.  No orders of the defined types were placed in this encounter.   Janyth Pupa, DO Attending [place], [facility] for [employer], [place]

## 2022-11-03 ENCOUNTER — Telehealth (HOSPITAL_COMMUNITY): Payer: Self-pay | Admitting: *Deleted

## 2022-11-03 ENCOUNTER — Encounter (HOSPITAL_COMMUNITY): Payer: Self-pay

## 2022-11-05 ENCOUNTER — Other Ambulatory Visit: Payer: Self-pay | Admitting: Advanced Practice Midwife

## 2022-11-05 ENCOUNTER — Ambulatory Visit (INDEPENDENT_AMBULATORY_CARE_PROVIDER_SITE_OTHER): Payer: BC Managed Care – PPO | Admitting: Obstetrics and Gynecology

## 2022-11-05 VITALS — BP 122/85 | HR 69 | Wt 238.0 lb

## 2022-11-05 DIAGNOSIS — N898 Other specified noninflammatory disorders of vagina: Secondary | ICD-10-CM

## 2022-11-05 DIAGNOSIS — Z8759 Personal history of other complications of pregnancy, childbirth and the puerperium: Secondary | ICD-10-CM

## 2022-11-05 DIAGNOSIS — O0990 Supervision of high risk pregnancy, unspecified, unspecified trimester: Secondary | ICD-10-CM

## 2022-11-05 DIAGNOSIS — O0993 Supervision of high risk pregnancy, unspecified, third trimester: Secondary | ICD-10-CM

## 2022-11-05 DIAGNOSIS — O99820 Streptococcus B carrier state complicating pregnancy: Secondary | ICD-10-CM

## 2022-11-05 DIAGNOSIS — Z3A38 38 weeks gestation of pregnancy: Secondary | ICD-10-CM

## 2022-11-05 NOTE — Telephone Encounter (Signed)
Preadmission screen  

## 2022-11-05 NOTE — Progress Notes (Signed)
   PRENATAL VISIT NOTE  Subjective:  Rebekah Hicks is a 37 y.o. G3P1101 at [redacted]w[redacted]d being seen today for ongoing prenatal care.  She is currently monitored for the following issues for this high-risk pregnancy and has Hyperlipidemia, mixed; Dysplasia of cervix, high grade CIN 2; History of IUFD; Supervision of high risk pregnancy, antepartum; Postpartum hypertension; and GBS (group B Streptococcus carrier), +RV culture, currently pregnant on their problem list.  Patient reports  small gushes of watery discharge .  Contractions: Not present. Vag. Bleeding: None.  Movement: Present. Denies leaking of fluid.   The following portions of the patient's history were reviewed and updated as appropriate: allergies, current medications, past family history, past medical history, past social history, past surgical history and problem list.   Objective:   Vitals:   11/05/22 1528  BP: 122/85  Pulse: 69  Weight: 238 lb (108 kg)   Fetal Status: Fetal Heart Rate (bpm): 142   Movement: Present     General:  Alert, oriented and cooperative. Patient is in no acute distress.  Skin: Skin is warm and dry. No rash noted.   Cardiovascular: Normal heart rate noted  Respiratory: Normal respiratory effort, no problems with respiration noted  Abdomen: Soft, gravid, appropriate for gestational age.  Pain/Pressure: Absent      SCE 3/40/-3, post, soft - performed in presence of chaperone SSE negative pool, negative nitrazine, negative ferning  Assessment and Plan:  Pregnancy: G3P1101 at [redacted]w[redacted]d 1. Supervision of high risk pregnancy, antepartum 3. GBS (group B Streptococcus carrier), +RV culture, currently pregnant Reviewed +GBS  2. History of IUFD IOL scheduled 1/29 Last growth 1/15: @37 /4: cephalic, 0981X (91%), AFI 10.46, ant  4. Vaginal discharge Negative pool, nitrazine, & fern  Future Appointments  Date Time Provider [place]  11/10/2022  6:30 AM MC-LD SCHED ROOM [facility]-INDC None    Inez Catalina, MD

## 2022-11-06 ENCOUNTER — Other Ambulatory Visit (HOSPITAL_COMMUNITY): Payer: Self-pay | Admitting: Advanced Practice Midwife

## 2022-11-06 ENCOUNTER — Encounter (HOSPITAL_COMMUNITY): Payer: Self-pay | Admitting: *Deleted

## 2022-11-06 ENCOUNTER — Encounter: Payer: Self-pay | Admitting: *Deleted

## 2022-11-06 ENCOUNTER — Telehealth (HOSPITAL_COMMUNITY): Payer: Self-pay | Admitting: *Deleted

## 2022-11-06 NOTE — Telephone Encounter (Signed)
Preadmission screen  

## 2022-11-07 ENCOUNTER — Other Ambulatory Visit: Payer: Self-pay | Admitting: Advanced Practice Midwife

## 2022-11-07 ENCOUNTER — Other Ambulatory Visit (HOSPITAL_COMMUNITY): Payer: Self-pay | Admitting: Advanced Practice Midwife

## 2022-11-10 ENCOUNTER — Encounter (HOSPITAL_COMMUNITY): Payer: Self-pay | Admitting: Family Medicine

## 2022-11-10 ENCOUNTER — Inpatient Hospital Stay (HOSPITAL_COMMUNITY): Payer: BC Managed Care – PPO

## 2022-11-10 ENCOUNTER — Inpatient Hospital Stay (HOSPITAL_COMMUNITY): Payer: BC Managed Care – PPO | Admitting: Anesthesiology

## 2022-11-10 ENCOUNTER — Inpatient Hospital Stay (HOSPITAL_COMMUNITY)
Admission: RE | Admit: 2022-11-10 | Discharge: 2022-11-12 | DRG: 807 | Disposition: A | Discharge status: Home / Self Care | Payer: BC Managed Care – PPO | Attending: Family Medicine | Admitting: Family Medicine

## 2022-11-10 DIAGNOSIS — Z8759 Personal history of other complications of pregnancy, childbirth and the puerperium: Secondary | ICD-10-CM

## 2022-11-10 DIAGNOSIS — O99824 Streptococcus B carrier state complicating childbirth: Secondary | ICD-10-CM | POA: Diagnosis present

## 2022-11-10 DIAGNOSIS — O26893 Other specified pregnancy related conditions, third trimester: Principal | ICD-10-CM | POA: Diagnosis present

## 2022-11-10 DIAGNOSIS — Z3A39 39 weeks gestation of pregnancy: Secondary | ICD-10-CM | POA: Diagnosis not present

## 2022-11-10 DIAGNOSIS — O0990 Supervision of high risk pregnancy, unspecified, unspecified trimester: Principal | ICD-10-CM

## 2022-11-10 DIAGNOSIS — Z87410 Personal history of cervical dysplasia: Secondary | ICD-10-CM | POA: Diagnosis not present

## 2022-11-10 DIAGNOSIS — N871 Moderate cervical dysplasia: Secondary | ICD-10-CM | POA: Diagnosis present

## 2022-11-10 DIAGNOSIS — O165 Unspecified maternal hypertension, complicating the puerperium: Secondary | ICD-10-CM

## 2022-11-10 DIAGNOSIS — O99820 Streptococcus B carrier state complicating pregnancy: Secondary | ICD-10-CM

## 2022-11-10 LAB — CBC
HCT: 29.8 % — ABNORMAL LOW (ref 36.0–46.0)
Hemoglobin: 10.2 g/dL — ABNORMAL LOW (ref 12.0–15.0)
MCH: 30.5 pg (ref 26.0–34.0)
MCHC: 34.2 g/dL (ref 30.0–36.0)
MCV: 89.2 fL (ref 80.0–100.0)
Platelets: 212 10*3/uL (ref 150–400)
RBC: 3.34 MIL/uL — ABNORMAL LOW (ref 3.87–5.11)
RDW: 13.9 % (ref 11.5–15.5)
WBC: 9.4 10*3/uL (ref 4.0–10.5)
nRBC: 0 % (ref 0.0–0.2)

## 2022-11-10 LAB — TYPE AND SCREEN
ABO/RH(D): A POS
Antibody Screen: NEGATIVE

## 2022-11-10 LAB — RPR: RPR Ser Ql: NONREACTIVE

## 2022-11-10 MED ORDER — COCONUT OIL OIL: 1.0000 | TOPICAL_OIL | Status: DC | PRN

## 2022-11-10 MED ORDER — OXYTOCIN BOLUS FROM INFUSION: 333.0000 mL | Freq: Once | INTRAVENOUS | Status: DC

## 2022-11-10 MED ORDER — SODIUM CHLORIDE 0.9 % IV SOLN
5.0000 10*6.[IU] | Freq: Once | INTRAVENOUS | Status: AC
  Administered 2022-11-10: 5 10*6.[IU] via INTRAVENOUS
  Filled 2022-11-10: qty 5

## 2022-11-10 MED ORDER — EPHEDRINE 5 MG/ML INJ: 10.0000 mg | INTRAVENOUS | Status: DC | PRN

## 2022-11-10 MED ORDER — LACTATED RINGERS IV SOLN: 500.0000 mL | Freq: Once | INTRAVENOUS | Status: DC

## 2022-11-10 MED ORDER — ONDANSETRON HCL 4 MG PO TABS: 4.0000 mg | ORAL_TABLET | ORAL | Status: DC | PRN

## 2022-11-10 MED ORDER — ZOLPIDEM TARTRATE 5 MG PO TABS: 5.0000 mg | ORAL_TABLET | Freq: Every evening | ORAL | Status: DC | PRN

## 2022-11-10 MED ORDER — TERBUTALINE SULFATE 1 MG/ML IJ SOLN: 0.2500 mg | Freq: Once | INTRAMUSCULAR | Status: DC | PRN

## 2022-11-10 MED ORDER — LIDOCAINE HCL (PF) 1 % IJ SOLN: 30.0000 mL | INTRAMUSCULAR | Status: DC | PRN

## 2022-11-10 MED ORDER — OXYTOCIN-SODIUM CHLORIDE 30-0.9 UT/500ML-% IV SOLN
1.0000 m[IU]/min | INTRAVENOUS | Status: DC
  Administered 2022-11-10: 2 m[IU]/min via INTRAVENOUS

## 2022-11-10 MED ORDER — TETANUS-DIPHTH-ACELL PERTUSSIS 5-2.5-18.5 LF-MCG/0.5 IM SUSY: 0.5000 mL | PREFILLED_SYRINGE | Freq: Once | INTRAMUSCULAR | Status: DC

## 2022-11-10 MED ORDER — PHENYLEPHRINE 80 MCG/ML (10ML) SYRINGE FOR IV PUSH (FOR BLOOD PRESSURE SUPPORT): 80.0000 ug | PREFILLED_SYRINGE | INTRAVENOUS | Status: DC | PRN

## 2022-11-10 MED ORDER — SENNOSIDES-DOCUSATE SODIUM 8.6-50 MG PO TABS
2.0000 | ORAL_TABLET | Freq: Every day | ORAL | Status: DC
  Administered 2022-11-11: 2 via ORAL
  Filled 2022-11-10: qty 2

## 2022-11-10 MED ORDER — PRENATAL MULTIVITAMIN CH
1.0000 | ORAL_TABLET | Freq: Every day | ORAL | Status: DC
  Administered 2022-11-11: 1 via ORAL
  Filled 2022-11-10: qty 1

## 2022-11-10 MED ORDER — BENZOCAINE-MENTHOL 20-0.5 % EX AERO
1.0000 | INHALATION_SPRAY | CUTANEOUS | Status: DC | PRN
  Administered 2022-11-11: 1 via TOPICAL
  Filled 2022-11-10: qty 56

## 2022-11-10 MED ORDER — ACETAMINOPHEN 325 MG PO TABS: 650.0000 mg | ORAL_TABLET | ORAL | Status: DC | PRN

## 2022-11-10 MED ORDER — OXYCODONE-ACETAMINOPHEN 5-325 MG PO TABS: 2.0000 | ORAL_TABLET | ORAL | Status: DC | PRN

## 2022-11-10 MED ORDER — SOD CITRATE-CITRIC ACID 500-334 MG/5ML PO SOLN: 30.0000 mL | ORAL | Status: DC | PRN

## 2022-11-10 MED ORDER — WITCH HAZEL-GLYCERIN EX PADS: 1.0000 | MEDICATED_PAD | CUTANEOUS | Status: DC | PRN

## 2022-11-10 MED ORDER — OXYTOCIN-SODIUM CHLORIDE 30-0.9 UT/500ML-% IV SOLN
2.5000 [IU]/h | INTRAVENOUS | Status: DC
  Filled 2022-11-10: qty 500

## 2022-11-10 MED ORDER — IBUPROFEN 600 MG PO TABS
600.0000 mg | ORAL_TABLET | Freq: Four times a day (QID) | ORAL | Status: DC
  Administered 2022-11-11 (×2): 600 mg via ORAL
  Filled 2022-11-10 (×3): qty 1

## 2022-11-10 MED ORDER — DIPHENHYDRAMINE HCL 25 MG PO CAPS: 25.0000 mg | ORAL_CAPSULE | Freq: Four times a day (QID) | ORAL | Status: DC | PRN

## 2022-11-10 MED ORDER — MISOPROSTOL 50MCG HALF TABLET: 50.0000 ug | ORAL_TABLET | Freq: Once | ORAL | Status: DC

## 2022-11-10 MED ORDER — DIBUCAINE (PERIANAL) 1 % EX OINT: 1.0000 | TOPICAL_OINTMENT | CUTANEOUS | Status: DC | PRN

## 2022-11-10 MED ORDER — OXYCODONE-ACETAMINOPHEN 5-325 MG PO TABS: 1.0000 | ORAL_TABLET | ORAL | Status: DC | PRN

## 2022-11-10 MED ORDER — ONDANSETRON HCL 4 MG/2ML IJ SOLN: 4.0000 mg | Freq: Four times a day (QID) | INTRAMUSCULAR | Status: DC | PRN

## 2022-11-10 MED ORDER — LIDOCAINE HCL (PF) 1 % IJ SOLN
INTRAMUSCULAR | Status: DC | PRN
  Administered 2022-11-10: 10 mL via EPIDURAL

## 2022-11-10 MED ORDER — LACTATED RINGERS IV SOLN: INTRAVENOUS | Status: DC

## 2022-11-10 MED ORDER — DIPHENHYDRAMINE HCL 50 MG/ML IJ SOLN: 12.5000 mg | INTRAMUSCULAR | Status: DC | PRN

## 2022-11-10 MED ORDER — FENTANYL-BUPIVACAINE-NACL 0.5-0.125-0.9 MG/250ML-% EP SOLN
12.0000 mL/h | EPIDURAL | Status: DC | PRN
  Administered 2022-11-10: 12 mL/h via EPIDURAL
  Filled 2022-11-10: qty 250

## 2022-11-10 MED ORDER — SIMETHICONE 80 MG PO CHEW: 80.0000 mg | CHEWABLE_TABLET | ORAL | Status: DC | PRN

## 2022-11-10 MED ORDER — ONDANSETRON HCL 4 MG/2ML IJ SOLN: 4.0000 mg | INTRAMUSCULAR | Status: DC | PRN

## 2022-11-10 MED ORDER — PENICILLIN G POT IN DEXTROSE 60000 UNIT/ML IV SOLN
3.0000 10*6.[IU] | INTRAVENOUS | Status: DC
  Administered 2022-11-10 (×2): 3 10*6.[IU] via INTRAVENOUS
  Filled 2022-11-10 (×2): qty 50

## 2022-11-10 MED ORDER — LACTATED RINGERS IV SOLN: 500.0000 mL | INTRAVENOUS | Status: DC | PRN

## 2022-11-10 MED ORDER — MISOPROSTOL 25 MCG QUARTER TABLET: 25.0000 ug | ORAL_TABLET | Freq: Once | ORAL | Status: DC

## 2022-11-10 NOTE — H&P (Signed)
OBSTETRIC ADMISSION HISTORY AND PHYSICAL  Jazzmine Kleiman Ammon is a 37 y.o. female G61P1101 with IUP at [redacted]w[redacted]d by early [place] presenting for IOL for IUFD. She reports +FMs, No LOF, no VB, no blurry vision, headaches or peripheral edema, and RUQ pain.  She plans on breast feeding via pumping. She request vasectomy (does not desire bridge) for birth control. She received her prenatal [facility] at [facility]   Dating: By early [place] --->  Estimated Date of Delivery: 11/13/22  Sono: @[redacted]w[redacted]d , CWD, normal anatomy, cephalic presentation, 9937J, 39% EFW   Prenatal History/Complications:  Prior IUFD @ [redacted]w[redacted]d for genetic abnormalities   Nursing Staff Provider  Office Location  [place] Dating  L=10  [facility] Model [x ] Traditional [ ]  Centering [ ]  Mom-Baby Dyad      Language  english Anatomy US  Normal with limited views and has f/u scheduled.   Flu Vaccine    Genetic/Carrier Screen  NIPS: NML female   AFP:  nml  Horizon:NA  TDaP Vaccine   Done 08/26/22 Hgb A1C or  GTT Early  Third trimester: Normal   COVID Vaccine     LAB RESULTS   Rhogam  NA Blood Type A/RH(D) POSITIVE/-- (07/17 1117)   Baby Feeding Plan Pumping  Antibody NO ANTIBODIES DETECTED (07/17 1117)  Contraception Vasectomy, bridge with POP or Depo Rubella 2.14 (07/17 1117)  Circumcision N/a RPR NON-REACTIVE (11/14 0819)   [place] in [place] HBsAg NON-REACTIVE (07/17 1117)   Support Person FOB, [place] HCVAb NR  Prenatal Classes   HIV NON-REACTIVE (11/14 0819)     BTL Consent   GBS  Positive  VBAC Consent   Pap 04/2021: wnl, HPV neg           DME Rx [ ]  BP cuff [ ]  Weight Scale Waterbirth  [ ]  Class [ ]  Consent [ ]  CNM visit  PHQ9 & GAD7 [  ] new OB [  ] 28 weeks  [  ] 36 weeks Induction  [ ]  Orders Entered [ ] Foley Y/N    Past Medical History: Past Medical History:  Diagnosis Date   Migraine    Obesity during pregnancy, antepartum 06/28/2019   Recommendations [x]  Aspirin 81 mg daily after 12 weeks; discontinue 6 weeks postpartum  [ ]   Nutrition consult [ ]  Weight gain 11-20 lbs for singleton and 25-35 lbs for twin pregnancy (IOM guidelines) Higher class of obesity patients recommended to gain closer to lower limit  Weight loss is associated with adverse outcomes [ ]  Baseline and surveillance labs (pulled in from [facility], refresh links as ne   Supervision of normal pregnancy 06/28/2019    Nursing Staff Provider Office Location  [place] Dating  LMP Language  English Anatomy US   f/u in 4 weeks to complete anatomy  Flu Vaccine  07/06/19 Genetic Screen  NIPS: Low Risk female AFP:     TDaP vaccine   10/28/19 Hgb A1C or  GTT Early A1C 5 Third trimester normal Rhogam  N/A   LAB RESULTS  Feeding Plan Breast Blood Type A/RH(D) POSITIVE/-- (09/15 1338)  Contraception List given 2/12 Antibody NO   Traumatic injury during pregnancy in second trimester 10/19/2019   Vaginal Pap smear, abnormal     Past Surgical History: Past Surgical History:  Procedure Laterality Date   NO PAST SURGERIES      Obstetrical History: OB History     Gravida  3   Para  2   Term  1   Preterm  1   AB  0   Living  1      SAB  0   IAB  0   Ectopic  0   Multiple  0   Live Births  1           Social History Social History   Socioeconomic History   Marital status: Married    Spouse name: Delton See   Number of children: Not on file   Years of education: Not on file   Highest education level: Not on file  Occupational History   Occupation: Runner, 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  occ caffeine.    Social Determinants of Health    Financial Resource Strain: Not on file  Food Insecurity: Not on file  Transportation Needs: Not on file  Physical Activity: Not on file  Stress: Not on file  Social Connections: Not on file    Family History: Family History  Problem Relation Age of Onset   Hyperlipidemia Mother    Hypertension Mother    Heart attack Father    Hypertension Father    Hyperlipidemia Father    Diabetes Father    Diabetes Maternal Grandmother    Alzheimer's disease Maternal Grandfather    Heart attack Paternal Grandmother    Stroke Paternal Grandmother    Diabetes Other        grandparent   Asthma Neg Hx    Cancer Neg Hx     Allergies: No Known Allergies  Medications Prior to Admission  Medication Sig Dispense Refill Last Dose   Prenatal Vit-Fe Fumarate-FA (PRENATAL MULTIVITAMIN) TABS tablet Take 1 tablet by mouth daily at 12 noon.        Review of Systems   All systems reviewed and negative except as stated in HPI  Last menstrual period 02/17/2022. General appearance: alert, cooperative, and appears stated age Lungs: clear to auscultation bilaterally Heart: regular rate and rhythm Abdomen: soft, non-tender; bowel sounds normal Extremities: Homans sign is negative, no sign of DVT Presentation: cephalic Fetal monitoringBaseline: 150 bpm, Variability: Good {> 6 bpm), Accelerations: Reactive, and Decelerations: Absent Uterine activity: irregular     Prenatal labs: ABO, Rh: A/RH(D) POSITIVE/-- (07/17 1117) Antibody: NO ANTIBODIES DETECTED (07/17 1117) Rubella: 2.14 (07/17 1117) RPR: NON-REACTIVE (11/14 0819)  HBsAg: NON-REACTIVE (07/17 1117)  HIV: NON-REACTIVE (11/14 0819)  GBS:    1 hr Glucola: normal (08/26/2022) Genetic screening: normal Anatomy US: normal (06/19/2022)  Prenatal Transfer Tool  Maternal Diabetes: No Genetic Screening: Normal Maternal Ultrasounds/Referrals: Normal Fetal Ultrasounds or other Referrals:  Referred to Materal Fetal Medicine  Maternal  Substance Abuse:  No Significant Maternal Medications:  None Significant Maternal Lab Results: Group B Strep negative  No results found for this or any previous visit (from the past 24 hour(s)).  Patient Active Problem List   Diagnosis Date Noted   GBS (group B Streptococcus carrier), +RV culture, currently pregnant 10/17/2022   Postpartum hypertension 05/27/2022   Supervision of high risk pregnancy, antepartum  04/25/2022   History of IUFD 09/05/2021   Dysplasia of cervix, high grade CIN 2 06/29/2015   Hyperlipidemia, mixed 05/23/2013    Assessment/Plan:  Allisson Schindel Vien is a 37 y.o. G3P1101 at [redacted]w[redacted]d here for IOL for IUFD  #Labor: IOL with Foley balloon filled with 40cc saline and low dose pitocin.  #Pain: Epidural upon request #FWB: Cat 1 #ID:  GBS neg #MOF: Breast feeding vis pumping #MOC: Vasectomy #Circ:  N/A  Colletta Maryland, MD  11/10/2022, 9:59 AM

## 2022-11-10 NOTE — Discharge Summary (Signed)
Postpartum Discharge Summary  Date of Service updated***     Patient Name: Rebekah Hicks DOB: [birth year] MRN: [id number]  Date of admission: 11/10/2022 Delivery date:11/10/2022  Delivering provider:   Date of discharge: 11/10/2022  Admitting diagnosis: History of IUFD [Z87.59] Intrauterine pregnancy: [redacted]w[redacted]d     Secondary diagnosis:  Principal Problem:   History of IUFD Active Problems:   SVD (spontaneous vaginal delivery)  Additional problems: None    Discharge diagnosis: Term Pregnancy Delivered                                              Post partum procedures:{Postpartum procedures:23558} Augmentation: AROM, Pitocin, and IP Foley Complications: None  Hospital course: Induction of Labor With Vaginal Delivery   37 y.o. yo G3P1101 at [redacted]w[redacted]d was admitted to the hospital 11/10/2022 for induction of labor.  Indication for induction:  H/o IUFD @ 23 weeks .  Patient had an uncomplicated labor course Membrane Rupture Time/Date: 3:49 PM ,11/10/2022   Delivery Method:Vaginal, Spontaneous  Episiotomy: None  Lacerations:  None  Details of delivery can be found in separate delivery note.  Patient had a postpartum course complicated by***. Patient is discharged home 11/10/22.  Newborn Data: Birth date:11/10/2022  Birth time:7:52 PM  Gender:Female  Living status:Living  Apgars:8 ,9  Weight:   Magnesium Sulfate received: No BMZ received: No Rhophylac:No MMR:N/A T-DaP:Given prenatally Flu: No Transfusion:{Transfusion received:30440034}  Physical exam  Vitals:   11/10/22 1701 11/10/22 1732 11/10/22 1832 11/10/22 1901  BP: 105/65 106/76 111/75 (!) 97/58  Pulse: 80 82 86 (!) 112  Resp: 20     Temp: 97.6 F (36.4 C)     TempSrc: Oral      General: {Exam; general:21111117} Lochia: {Desc; appropriate/inappropriate:30686::"appropriate"} Uterine Fundus: {Desc; firm/soft:30687} Incision: {Exam; incision:21111123} DVT Evaluation: {Exam; dvt:2111122} Labs: Lab Results   Component Value Date   WBC 9.4 11/10/2022   HGB 10.2 (L) 11/10/2022   HCT 29.8 (L) 11/10/2022   MCV 89.2 11/10/2022   PLT 212 11/10/2022      Latest Ref Rng & Units 08/03/2021   12:00 PM  CMP  Glucose 70 - 99 mg/dL 81   BUN 6 - 20 mg/dL <5   Creatinine 0.44 - 1.00 mg/dL 0.61   Sodium 135 - 145 mmol/L 135   Potassium 3.5 - 5.1 mmol/L 3.6   Chloride 98 - 111 mmol/L 107   CO2 22 - 32 mmol/L 22   Calcium 8.9 - 10.3 mg/dL 8.8   Total Protein 6.5 - 8.1 g/dL 6.3   Total Bilirubin 0.3 - 1.2 mg/dL 0.5   Alkaline Phos 38 - 126 U/L 87   AST 15 - 41 U/L 14   ALT 0 - 44 U/L 9    Edinburgh Score:    09/03/2021    3:35 PM  Edinburgh Postnatal Depression Scale Screening Tool  I have been able to laugh and see the funny side of things. 0  I have looked forward with enjoyment to things. 0  I have blamed myself unnecessarily when things went wrong. 0  I have been anxious or worried for no good reason. 2  I have felt scared or panicky for no good reason. 2  Things have been getting on top of me. 1  I have been so unhappy that I have had difficulty sleeping. 1  I have felt sad  or miserable. 1  I have been so unhappy that I have been crying. 2  The thought of harming myself has occurred to me. 0  Edinburgh Postnatal Depression Scale Total 9     After visit meds:  Allergies as of 11/10/2022   No Known Allergies   Med Rec must be completed prior to using this [facility]***        Discharge home in stable condition Infant Feeding: {Baby feeding:23562} Infant Disposition:{CHL IP OB HOME WITH HYIFOY:77412} Discharge instruction: per After Visit Summary and Postpartum booklet. Activity: Advance as tolerated. Pelvic rest for 6 weeks.  Diet: {OB INOM:76720947} Future Appointments:No future appointments. Follow up Visit:   Please schedule this patient for a In person postpartum visit in 4 weeks with the following provider: Any provider. Additional Postpartum F/U: None   Low risk  pregnancy complicated by:  None Delivery mode:  Vaginal, Spontaneous  Anticipated Birth Control:  IUD   11/10/2022 Colletta Maryland, MD

## 2022-11-10 NOTE — Lactation Note (Signed)
This note was copied from a baby's chart. Lactation Consultation Note  Patient Name: Rebekah Hicks [id number] Date: 11/10/2022 Reason for consult: Initial assessment;1st time breastfeeding;Term;Breastfeeding assistance Age:37 hours  LC in to see P2 mother of 39.[redacted] weeks gestation infant born vaginally.   Mother in cheerful spirits and receptive to [facility] visit. She reports baby Rebekah "Rebekah Hicks" latched well after delivery. Mother states she is pleased baby latched easily and if breastfeeding goes well, she will continue. She states " I just don't want to stress over it" and had planned to pump and give by bottle. Baby showing cues and mother wanted assist with latch. Basic breastfeeding instructions explained while assisting mother in the football hold. Baby latched well with ease, mother denied discomfort and felt tugs at the breast.   Instructed mother to watch for feeding cues, place baby skin to skin while mother is alert and awake and call for assistance with latch as needed. Instructed mother that [facility] can set up a DEBP if she still wants to pump. Currently, mother plans to latch baby.  Maternal Data Has patient been taught Hand Expression?: Yes Does the patient have breastfeeding experience prior to this delivery?: No (briefly tried with last child)  Feeding Mother's Current Feeding Choice: Breast Milk  LATCH Score Latch: Grasps breast easily, tongue down, lips flanged, rhythmical sucking.  Audible Swallowing: A few with stimulation  Type of Nipple: Everted at rest and after stimulation (right breast)  Comfort (Breast/Nipple): Soft / non-tender  Hold (Positioning): Assistance needed to correctly position infant at breast and maintain latch.  LATCH Score: 8   Lactation Tools Discussed/Used    Interventions Interventions: Breast feeding basics reviewed;Assisted with latch;Skin to skin;Hand express;Breast compression;Adjust position;Support pillows;Position options;Education;LC  Services brochure  Discharge Pump: DEBP;Personal  Consult Status Consult Status: Follow-up Date: 11/11/22 Follow-up type: In-patient    Stana Bunting M 11/10/2022, 10:28 PM

## 2022-11-10 NOTE — Anesthesia Procedure Notes (Signed)
Epidural Patient location during procedure: OB Start time: 11/10/2022 3:15 PM End time: 11/10/2022 3:20 PM  Staffing Anesthesiologist: Lyn Hollingshead, MD Performed: anesthesiologist   Preanesthetic Checklist Completed: patient identified, IV checked, site marked, risks and benefits discussed, surgical consent, monitors and equipment checked, pre-op evaluation and timeout performed  Epidural Patient position: sitting Prep: DuraPrep and site prepped and draped Patient monitoring: continuous pulse ox and blood pressure Approach: midline Location: L3-L4 Injection technique: LOR air  Needle:  Needle type: Tuohy  Needle gauge: 17 G Needle length: 9 cm and 9 Needle insertion depth: 6 cm Catheter type: closed end flexible Catheter size: 19 Gauge Catheter at skin depth: 11 cm Test dose: negative and Other  Assessment Events: blood not aspirated, no cerebrospinal fluid, injection not painful, no injection resistance, no paresthesia and negative IV test  Additional Notes Reason for block:procedure for pain

## 2022-11-10 NOTE — Progress Notes (Signed)
Labor Progress Note  Rebekah Hicks is a 37 y.o. G3P1101 at [redacted]w[redacted]d presented for IOL for IUFD  S: Epidural in place and feels comfortable.  O:  BP 105/64   Pulse 80   Temp 97.8 F (36.6 C) (Oral)   LMP 02/17/2022  EFM: 150bpm/Moderate variability/ 15x15 accels/ None decels  CVE: Dilation: 2.5 Effacement (%): 50 Cervical Position: Posterior Station: -2 Exam by:: Pray MD   [name]: 37 y.o. X5T7001 [redacted]w[redacted]d  here for IOL as above  #Labor: Progressing well. Foley balloon out, AROM with blood-tinged fluid. Continue titration of pitocin and expectant management. #Pain: Epidural #FWB: CAT 1 #GBS negative  Colletta Maryland, MD 11/10/22 3:46 PM

## 2022-11-10 NOTE — Anesthesia Preprocedure Evaluation (Signed)
Anesthesia Evaluation  Patient identified by MRN, date of birth, ID band Patient awake    Reviewed: Allergy & Precautions, H&P , NPO status , Patient's Chart, lab work & pertinent test results  History of Anesthesia Complications Negative for: history of anesthetic complications  Airway Mallampati: I       Dental no notable dental hx.    Pulmonary neg pulmonary ROS   Pulmonary exam normal        Cardiovascular Normal cardiovascular exam     Neuro/Psych  negative psych ROS   GI/Hepatic negative GI ROS, Neg liver ROS,,,  Endo/Other  negative endocrine ROS    Renal/GU negative Renal ROS  negative genitourinary   Musculoskeletal negative musculoskeletal ROS (+)    Abdominal Normal abdominal exam  (+)   Peds  Hematology negative hematology ROS (+)   Anesthesia Other Findings   Reproductive/Obstetrics (+) Pregnancy (IUFD)                             Anesthesia Physical Anesthesia Plan  ASA: 2  Anesthesia Plan: Epidural   Post-op Pain Management:    Induction:   PONV Risk Score and Plan:   Airway Management Planned:   Additional Equipment:   Intra-op Plan:   Post-operative Plan:   Informed Consent: I have reviewed the patients History and Physical, chart, labs and discussed the procedure including the risks, benefits and alternatives for the proposed anesthesia with the patient or authorized representative who has indicated his/her understanding and acceptance.       Plan Discussed with:   Anesthesia Plan Comments:         Anesthesia Quick Evaluation

## 2022-11-11 NOTE — Anesthesia Postprocedure Evaluation (Signed)
Anesthesia Post Note  Patient: Rebekah Hicks  Procedure(s) Performed: AN AD [place]     Patient location during evaluation: Mother Baby Anesthesia Type: Epidural Level of consciousness: awake and alert Pain management: pain level controlled Vital Signs Assessment: post-procedure vital signs reviewed and stable Respiratory status: spontaneous breathing, nonlabored ventilation and respiratory function stable Cardiovascular status: stable Postop Assessment: no headache, no backache and epidural receding Anesthetic complications: no   No notable events documented.  Last Vitals:  Vitals:   11/11/22 0230 11/11/22 0630  BP: 106/62 109/66  Pulse: 86 68  Resp: 16 18  Temp: 36.8 C 36.8 C  SpO2:      Last Pain:  Vitals:   11/11/22 0730  TempSrc:   PainSc: 0-No pain   Pain Goal:                   Garcia Dalzell

## 2022-11-11 NOTE — Progress Notes (Signed)
POSTPARTUM PROGRESS NOTE  Post Partum Day 1  Subjective:  Kirstine Jacquin is a 37 y.o. X5A5697 s/p SVD at [redacted]w[redacted]d.  She reports she is doing well. No acute events overnight. She denies any problems with ambulating, voiding or po intake. Denies nausea or vomiting.  Pain is well controlled.  Lochia is appropriate.  Objective: Blood pressure 104/78, pulse 74, temperature 98.1 F (36.7 C), temperature source Oral, resp. rate 18, last menstrual period 02/17/2022, SpO2 99 %, unknown if currently breastfeeding.  Physical Exam:  General: alert, cooperative and no distress Chest: no respiratory distress Heart:regular rate, distal pulses intact Abdomen: soft, nontender,  Uterine Fundus: firm, appropriately tender DVT Evaluation: No calf swelling or tenderness Extremities: trace edema Skin: warm, dry  Recent Labs    11/10/22 1028  HGB 10.2*  HCT 29.8*    Assessment/Plan: Negar Sieler is a 37 y.o. X4I0165 s/p SVD at [redacted]w[redacted]d   PPD#1 - Doing well  Routine postpartum care  Contraception: Vasectomy Feeding: Breast Dispo: Plan for discharge tomorrow.   LOS: 1 day   Gifford Shave, MD  OB Fellow  11/11/2022, 9:12 AM

## 2022-11-12 ENCOUNTER — Other Ambulatory Visit (HOSPITAL_COMMUNITY): Payer: Self-pay

## 2022-11-12 ENCOUNTER — Encounter: Payer: Self-pay | Admitting: *Deleted

## 2022-11-12 LAB — BIRTH TISSUE RECOVERY COLLECTION (PLACENTA DONATION)

## 2022-11-12 MED ORDER — IBUPROFEN 600 MG PO TABS
600.0000 mg | ORAL_TABLET | Freq: Four times a day (QID) | ORAL | 0 refills | Status: AC
  Filled 2022-11-12: qty 30, 8d supply, fill #0

## 2022-11-12 MED ORDER — BENZOCAINE-MENTHOL 20-0.5 % EX AERO
1.0000 | INHALATION_SPRAY | CUTANEOUS | 0 refills | Status: AC | PRN
  Filled 2022-11-12: qty 78, fill #0

## 2022-11-12 NOTE — Lactation Note (Signed)
This note was copied from a baby's chart. Lactation Consultation Note  Patient Name: Rebekah Hicks [id number] Date: 11/12/2022 Reason for consult: Follow-up assessment Age:37 hours   P2: Term infant at 39+4 weeks Feeding preference: Breast/formula Weight loss: 2%  "Rebekah Hicks" was asleep in mother's arms when I arrived.  Mother feels like she has been latching/feeding well. Last LATCH scores 8-9; voiding/stooling.  Mother is planning on latching and supplementing with formula.  She had no questions/concerns and did not wish to review engorgement.  No support person present at this time.   Maternal Data    Feeding    LATCH Score                    Lactation Tools Discussed/Used    Interventions    Discharge Discharge Education: Engorgement and breast care (Mother did not wish to review) Pump: Personal  Consult Status Consult Status: Complete Date: 11/12/22 Follow-up type: Call as needed    Tiyah Zelenak R Polina Burmaster 11/12/2022, 8:04 AM

## 2022-11-21 ENCOUNTER — Telehealth (HOSPITAL_COMMUNITY): Payer: Self-pay | Admitting: *Deleted

## 2022-11-21 NOTE — Telephone Encounter (Signed)
Left phone voicemail message.  Odis Hollingshead, RN 11-21-2022 at 1:54pm

## 2022-12-19 ENCOUNTER — Other Ambulatory Visit (HOSPITAL_COMMUNITY): Payer: Self-pay

## 2022-12-23 NOTE — Progress Notes (Unsigned)
[facility] Part[facility] Visit Note  Rebekah Hicks is a 37 y.o. [phone] female who presents for a postpartum visit. She is 6 weeks postpartum following a normal spontaneous vaginal delivery.  I have fully reviewed the prenatal and intrapartum course. The delivery was at 39.4 gestational weeks.  Anesthesia: epidural. Postpartum course has been unremarkable. Baby is doing well. Baby is feeding by bottle - Enfamil AR. Bleeding no bleeding. Bowel function is normal. Bladder function is normal. Patient is not sexually active. Contraception method is condoms and vasectomy. Postpartum depression screening: negative.   The pregnancy intention screening data noted above was reviewed. Potential methods of contraception were discussed. The patient elected to proceed with No data recorded.   Edinburgh Postnatal Depression Scale - 12/24/22 1021       Edinburgh Postnatal Depression Scale:  In the Past 7 Days   I have been able to laugh and see the funny side of things. 0    I have looked forward with enjoyment to things. 0    I have blamed myself unnecessarily when things went wrong. 1    I have been anxious or worried for no good reason. 1    I have felt scared or panicky for no good reason. 1    Things have been getting on top of me. 2    I have been so unhappy that I have had difficulty sleeping. 0    I have felt sad or miserable. 0    I have been so unhappy that I have been crying. 1    The thought of harming myself has occurred to me. 0    Edinburgh Postnatal Depression Scale Total 6             Health Maintenance Due  Topic Date Due   COVID-19 Vaccine (1) Never done   INFLUENZA VACCINE  05/13/2022    The following portions of the patient's history were reviewed and updated as appropriate: allergies, current medications, past family history, past medical history, past social history, past surgical history, and problem list.  Review of Systems Pertinent items are noted in  HPI.  Objective:  BP 111/77   Pulse 63   Ht '5\' 6"'$  (1.676 m)   Wt 222 lb (100.7 kg)   Breastfeeding No   BMI 35.83 kg/m    General:  alert, cooperative, and no distress  Lungs: Normal respiratory effort  Heart:  Regular rate       Assessment:    1. Postpartum care and examination 6 wk postpartum exam.   Plan:   Essential components of care per ACOG recommendations:  1.  Mood and well being: Patient with negative depression screening today. Reviewed local resources for support.  - Patient tobacco use? No.   - hx of drug use? No.    2. Infant care and feeding:  -Patient currently breastmilk feeding? No.  -Social determinants of health (SDOH) reviewed in EPIC. No concerns  3. Sexuality, contraception and birth spacing - Patient does not want a pregnancy in the next year.  Desired family size is 2 children.  - Reviewed reproductive life planning. Reviewed contraceptive methods based on pt preferences and effectiveness.  Patient desired Female Condom today.  Information given for urologists.  - Discussed birth spacing of 18 months  4. Sleep and fatigue -Encouraged family/partner/community support of 4 hrs of uninterrupted sleep to help with mood and fatigue  5. Physical Recovery  - Discussed patients delivery and complications. She describes her labor  as good. - Patient had a Vaginal, no problems at delivery. Patient had no laceration.  - Patient has urinary incontinence? No. - Patient is safe to resume physical and sexual activity  6.  Health Maintenance - HM due items addressed Yes - Last pap smear  Diagnosis  Date Value Ref Range Status  05/07/2021   Final   - Negative for intraepithelial lesion or malignancy (NILM)   Pap smear not done at today's visit.  -Breast Cancer screening indicated? No.   7. Chronic Disease/Pregnancy Condition follow up: None  - PCP follow up  Radene Gunning, [place] for [facility], [place]

## 2022-12-24 ENCOUNTER — Encounter: Payer: Self-pay | Admitting: Obstetrics and Gynecology

## 2022-12-24 ENCOUNTER — Ambulatory Visit (INDEPENDENT_AMBULATORY_CARE_PROVIDER_SITE_OTHER): Payer: BC Managed Care – PPO | Admitting: Obstetrics and Gynecology

## 2022-12-24 DIAGNOSIS — Z1339 Encounter for screening examination for other mental health and behavioral disorders: Secondary | ICD-10-CM | POA: Diagnosis not present

## 2023-02-04 ENCOUNTER — Encounter: Payer: Self-pay | Admitting: Obstetrics and Gynecology

## 2023-12-11 ENCOUNTER — Ambulatory Visit
Admission: RE | Admit: 2023-12-11 | Discharge: 2023-12-11 | Disposition: A | Discharge status: Home / Self Care | Payer: 59 | Source: Ambulatory Visit | Attending: Family Medicine | Admitting: Family Medicine

## 2023-12-11 VITALS — BP 125/77 | HR 98 | Temp 97.9°F | Resp 16

## 2023-12-11 DIAGNOSIS — J0100 Acute maxillary sinusitis, unspecified: Secondary | ICD-10-CM | POA: Diagnosis not present

## 2023-12-11 MED ORDER — AMOXICILLIN-POT CLAVULANATE 875-125 MG PO TABS: ORAL_TABLET | ORAL | 0 refills | Status: AC

## 2023-12-11 NOTE — Discharge Instructions (Signed)
 Increase fluid intake.  May take Pseudoephedrine (30mg , one or two every 4 to 6 hours) for sinus congestion.   May use Afrin nasal spray (or generic oxymetazoline) each morning for about 5 days and then discontinue.  Also recommend using saline nasal spray several times daily and saline nasal irrigation (AYR is a common brand).  Use Flonase nasal spray each morning after using Afrin nasal spray and saline nasal irrigation.

## 2023-12-11 NOTE — ED Provider Notes (Signed)
 [facility]    CSN: [id number] Arrival date & time: 12/11/23  0931      History   Chief Complaint Chief Complaint  Patient presents with   Facial Pain    HPI Rebekah Hicks is a 38 y.o. female.   Patient complains of 10 day history of "itchy" throat, headache, and nasal congestion with facial pressure.  Her sore throat became worse this morning and nasal drainage has become green.  She denies cough and fevers, chills, and sweats.  The history is provided by the patient.    Past Medical History:  Diagnosis Date   Migraine    Obesity during pregnancy, antepartum 06/28/2019   Recommendations [x]  Aspirin 81 mg daily after 12 weeks; discontinue 6 weeks postpartum  [ ]  Nutrition consult [ ]  Weight gain 11-20 lbs for singleton and 25-35 lbs for twin pregnancy (IOM guidelines) Higher class of obesity patients recommended to gain closer to lower limit  Weight loss is associated with adverse outcomes [ ]  Baseline and surveillance labs (pulled in from [facility], refresh links as ne   Supervision of normal pregnancy 06/28/2019    Nursing Staff Provider Office Location  [place] Dating  LMP Language  English Anatomy US   f/u in 4 weeks to complete anatomy  Flu Vaccine  07/06/19 Genetic Screen  NIPS: Low Risk female AFP:     TDaP vaccine   10/28/19 Hgb A1C or  GTT Early A1C 5 Third trimester normal Rhogam  N/A   LAB RESULTS  Feeding Plan Breast Blood Type A/RH(D) POSITIVE/-- (09/15 1338)  Contraception List given 2/12 Antibody NO   Traumatic injury during pregnancy in second trimester 10/19/2019   Vaginal Pap smear, abnormal     Patient Active Problem List   Diagnosis Date Noted   History of IUFD 09/05/2021   Dysplasia of cervix, 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 Authorizing Provider  amoxicillin-clavulanate (AUGMENTIN) 875-125 MG tablet Take one tab PO Q12hr with food 12/11/23  Yes Lattie Haw, MD  QSYMIA 15-92 MG CP24 Take 1 capsule by mouth daily. 11/26/23  Yes [provider]  acetaminophen (TYLENOL) 325 MG tablet Take 650 mg by mouth as needed for moderate pain. Patient not taking: Reported on 12/24/2022    [provider]  benzocaine-Menthol (DERMOPLAST) 20-0.5 % AERO Apply 1 Application topically as needed for irritation (perineal discomfort). Patient not taking: Reported on 12/24/2022 11/12/22   Celedonio Savage, MD  Calcium Carbonate Antacid (TUMS PO) Take 2 tablets by mouth 4 (four) times daily as needed (heartburn). Patient not taking: Reported on 12/24/2022    [provider]  ibuprofen (ADVIL) 600 MG tablet Take 1 tablet (600 mg total) by mouth every 6 (six) hours. Patient not taking: Reported on 12/24/2022 11/12/22   Celedonio Savage, MD  Prenatal Vit-Fe  Fumarate-FA (PRENATAL MULTIVITAMIN) TABS tablet Take 1 tablet by mouth daily at 12 noon. Patient not taking: Reported on 12/24/2022    [provider]    Family History Family History  Problem Relation Age of Onset   Hyperlipidemia Mother    Hypertension Mother    Heart attack Father    Hypertension Father    Hyperlipidemia Father    Diabetes Father    Diabetes Maternal Grandmother    Alzheimer's disease Maternal Grandfather    Heart attack Paternal Grandmother    Stroke Paternal Grandmother    Diabetes Other        grandparent   Asthma Neg Hx    Cancer Neg Hx     Social History Social History   Tobacco Use   Smoking status: Never   Smokeless tobacco: Never  Vaping Use   Vaping status: Never Used  Substance Use Topics   Alcohol use: Not Currently     Alcohol/week: 1.0 standard drink of alcohol    Types: 1 Standard drinks or equivalent per week   Drug use: No     Allergies   Patient has no known allergies.   Review of Systems Review of Systems + sore throat No cough No pleuritic pain No wheezing + nasal congestion + post-nasal drainage + sinus pain/pressure No itchy/red eyes No earache No hemoptysis No SOB No fever/chills No nausea No vomiting No abdominal pain No diarrhea No urinary symptoms No skin rash + fatigue No myalgias + headache   Physical Exam Triage Vital Signs ED Triage Vitals  Encounter Vitals Group     BP 12/11/23 1025 125/77     Systolic BP Percentile --      Diastolic BP Percentile --      Pulse Rate 12/11/23 1025 98     Resp 12/11/23 1025 16     Temp 12/11/23 1025 97.9 F (36.6 C)     Temp src --      SpO2 12/11/23 1025 98 %     Weight --      Height --      Head Circumference --      Peak Flow --      Pain Score 12/11/23 1024 7     Pain Loc --      Pain Education --      Exclude from Growth Chart --    No data found.  Updated Vital Signs BP 125/77   Pulse 98   Temp 97.9 F (36.6 C)   Resp 16   LMP 11/25/2023   SpO2 98%   Visual Acuity Right Eye Distance:   Left Eye Distance:   Bilateral Distance:    Right Eye Near:   Left Eye Near:    Bilateral Near:     Physical Exam Nursing notes and Vital Signs reviewed. Appearance:  Patient appears stated age, and in no acute distress Eyes:  Pupils are equal, round, and reactive to light and accomodation.  Extraocular movement is intact.  Conjunctivae are not inflamed  Ears:  Canals normal.  Tympanic membranes normal.  Nose:  Congested turbinates.  Maxillary sinus tenderness is present.  Pharynx:  Normal Neck:  Supple. No adenopathy  Lungs:  Clear to auscultation.  Breath sounds are equal.  Moving air well. Heart:  Regular rate and rhythm without murmurs, rubs, or gallops.  Abdomen:  Nontender without masses or  hepatosplenomegaly.  Bowel sounds are present.  No CVA or flank tenderness.  Extremities:  No edema.  Skin:  No rash present.   UC Treatments / Results  Labs (all labs ordered are listed, but only abnormal results are displayed) Labs Reviewed - No data to display  EKG   Radiology No results found.  Procedures Procedures (including critical care time)  Medications Ordered in UC Medications - No data to display  Initial Impression / Assessment and Plan / UC Course  I have reviewed the triage vital signs and the nursing notes.  Pertinent labs & imaging results that were available during my care of the patient were reviewed by me and considered in my medical decision making (see chart for details).    Begin Augmentin 875 Q12hr for 10 days. Followup with Family Doctor if not improved in one week.   Final Clinical Impressions(s) / UC Diagnoses   Final diagnoses:  Acute maxillary sinusitis, recurrence not specified     Discharge Instructions      Increase fluid intake.  May take Pseudoephedrine (30mg , one or two every 4 to 6 hours) for sinus congestion.   May use Afrin nasal spray (or generic oxymetazoline) each morning for about 5 days and then discontinue.  Also recommend using saline nasal spray several times daily and saline nasal irrigation (AYR is a common brand).  Use Flonase nasal spray each morning after using Afrin nasal spray and saline nasal irrigation.       ED Prescriptions     Medication Sig Dispense Auth. Provider   amoxicillin-clavulanate (AUGMENTIN) 875-125 MG tablet Take one tab PO Q12hr with food 20 tablet Lattie Haw, MD         Lattie Haw, MD 12/13/23 2043

## 2023-12-11 NOTE — ED Triage Notes (Signed)
 Pt presents to uc with co of facial congestion and runny nose, headaches with sore throat for 10 days. Pt reports musinex and afrin otc

## 2024-08-13 ENCOUNTER — Ambulatory Visit
Admission: RE | Admit: 2024-08-13 | Discharge: 2024-08-13 | Disposition: A | Discharge status: Home / Self Care | Source: Ambulatory Visit | Attending: Family Medicine

## 2024-08-13 ENCOUNTER — Other Ambulatory Visit: Payer: Self-pay

## 2024-08-13 VITALS — BP 111/76 | HR 79 | Temp 98.1°F | Resp 16

## 2024-08-13 DIAGNOSIS — L03113 Cellulitis of right upper limb: Secondary | ICD-10-CM

## 2024-08-13 MED ORDER — DOXYCYCLINE HYCLATE 100 MG PO CAPS: 100.0000 mg | ORAL_CAPSULE | Freq: Two times a day (BID) | ORAL | 0 refills | Status: AC

## 2024-08-13 NOTE — ED Provider Notes (Signed)
 [facility]    CSN: [id number] Arrival date & time: 08/13/24  1022      History   Chief Complaint Chief Complaint  Patient presents with   Wound Check    HPI Rebekah Hicks is a 38 y.o. female presents for hand infection.  Patient reports a week ago she sustained an abrasion to the lateral aspect of her right hand near the fifth MCP joint.  Does not know what she did to cause this.  States over the past 5 to 6 days she has had redness swelling and purulent drainage when she squeezes the area.  Denies any fevers or chills.  No history of MRSA.  Patient is up-to-date on her tetanus from 2023.  No other concerns at this time.   Wound Check    Past Medical History:  Diagnosis Date   Migraine    Obesity during pregnancy, antepartum 06/28/2019   Recommendations [x]  Aspirin  81 mg daily after 12 weeks; discontinue 6 weeks postpartum  [ ]  Nutrition consult [ ]  Weight gain 11-20 lbs for singleton and 25-35 lbs for twin pregnancy (IOM guidelines) Higher class of obesity patients recommended to gain closer to lower limit  Weight loss is associated with adverse outcomes [ ]  Baseline and surveillance labs (pulled in from [facility], refresh links as ne   Supervision of normal pregnancy 06/28/2019    Nursing Staff Provider Office Location  [place] Dating  LMP Language  English Anatomy US    f/u in 4 weeks to complete anatomy  Flu Vaccine  07/06/19 Genetic Screen  NIPS: Low Risk female AFP:     TDaP vaccine   10/28/19 Hgb A1C or  GTT Early A1C 5 Third trimester normal Rhogam  N/A   LAB RESULTS  Feeding Plan Breast Blood Type A/RH(D) POSITIVE/-- (09/15 1338)  Contraception List given 2/12 Antibody NO   Traumatic injury during pregnancy in second trimester 10/19/2019   Vaginal Pap smear, abnormal     Patient Active Problem List   Diagnosis Date Noted   History of IUFD 09/05/2021   Dysplasia of cervix, 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 Authorizing Provider  doxycycline  (VIBRAMYCIN ) 100 MG capsule Take 1 capsule (100 mg total) by mouth 2 (two) times daily. 08/13/24  Yes Kaeo Jacome, Jodi R, NP  acetaminophen  (TYLENOL ) 325 MG tablet Take 650 mg by mouth as needed for moderate pain. Patient not taking: Reported on 12/24/2022    [provider]  amoxicillin -clavulanate (AUGMENTIN ) 875-125 MG tablet Take one tab PO Q12hr with food 12/11/23   Pauline Garnette LABOR, MD  benzocaine -Menthol  (DERMOPLAST) 20-0.5 % AERO Apply 1 Application topically as needed for irritation (perineal discomfort). Patient not taking: Reported on 12/24/2022 11/12/22   Cresenzo, John V, MD  Calcium  Carbonate Antacid (TUMS PO) Take 2 tablets by mouth 4 (four) times daily  as needed (heartburn). Patient not taking: Reported on 12/24/2022    [provider]  ibuprofen  (ADVIL ) 600 MG tablet Take 1 tablet (600 mg total) by mouth every 6 (six) hours. Patient not taking: Reported on 12/24/2022 11/12/22   Cresenzo, John V, MD  Prenatal Vit-Fe Fumarate-FA (PRENATAL MULTIVITAMIN) TABS tablet Take 1 tablet by mouth daily at 12 noon. Patient not taking: Reported on 12/24/2022    [provider]  QSYMIA 15-92 MG CP24 Take 1 capsule by mouth daily. 11/26/23   [provider]    Family History Family History  Problem Relation Age of Onset   Hyperlipidemia Mother    Hypertension Mother    Heart attack Father    Hypertension Father    Hyperlipidemia Father    Diabetes Father    Diabetes Maternal Grandmother    Alzheimer's disease Maternal Grandfather    Heart attack Paternal Grandmother    Stroke Paternal  Grandmother    Diabetes Other        grandparent   Asthma Neg Hx    Cancer Neg Hx     Social History Social History   Tobacco Use   Smoking status: Never   Smokeless tobacco: Never  Vaping Use   Vaping status: Never Used  Substance Use Topics   Alcohol use: Not Currently    Alcohol/week: 1.0 standard drink of alcohol    Types: 1 Standard drinks or equivalent per week   Drug use: No     Allergies   Patient has no known allergies.   Review of Systems Review of Systems  Skin:  Positive for wound.     Physical Exam Triage Vital Signs ED Triage Vitals  Encounter Vitals Group     BP 08/13/24 1029 111/76     Girls Systolic BP Percentile --      Girls Diastolic BP Percentile --      Boys Systolic BP Percentile --      Boys Diastolic BP Percentile --      Pulse Rate 08/13/24 1029 79     Resp 08/13/24 1029 16     Temp 08/13/24 1029 98.1 F (36.7 C)     Temp Source 08/13/24 1029 Oral     SpO2 08/13/24 1029 98 %     Weight --      Height --      Head Circumference --      Peak Flow --      Pain Score 08/13/24 1027 6     Pain Loc --      Pain Education --      Exclude from Growth Chart --    No data found.  Updated Vital Signs BP 111/76   Pulse 79   Temp 98.1 F (36.7 C) (Oral)   Resp 16   LMP 08/13/2024   SpO2 98%   Breastfeeding No   Visual Acuity Right Eye Distance:   Left Eye Distance:   Bilateral Distance:    Right Eye Near:   Left Eye Near:    Bilateral Near:     Physical Exam Vitals and nursing note reviewed.  Constitutional:      General: She is not in acute distress.    Appearance: Normal appearance. She is not ill-appearing.  HENT:     Head: Normocephalic and atraumatic.  Eyes:     Pupils: Pupils are equal, round, and reactive to light.  Cardiovascular:     Rate and Rhythm: Normal rate.  Pulmonary:  Effort: Pulmonary effort is normal.  Musculoskeletal:       Hands:     Comments: There is a scabbed abrasion to the lateral  dorsal aspect of the right hand adjacent to the fifth MCP joint.  Area of erythema and warmth without induration.  No tenderness with palpation to the MCP joint.  Full range of motion of fingers without pain or restriction.  Cap refill +2 in all fingers.  Skin:    General: Skin is warm and dry.  Neurological:     General: No focal deficit present.     Mental Status: She is alert and oriented to person, place, and time.  Psychiatric:        Mood and Affect: Mood normal.        Behavior: Behavior normal.      UC Treatments / Results  Labs (all labs ordered are listed, but only abnormal results are displayed) Labs Reviewed - No data to display  EKG   Radiology No results found.  Procedures Procedures (including critical care time)  Medications Ordered in UC Medications - No data to display  Initial Impression / Assessment and Plan / UC Course  I have reviewed the triage vital signs and the nursing notes.  Pertinent labs & imaging results that were available during my care of the patient were reviewed by me and considered in my medical decision making (see chart for details).    Reviewed exam and symptoms with patient.  She is afebrile and well-appearing.  Will treat for cellulitis with doxycycline  twice daily for 10 days.  Advised to keep area clean and dry and to monitor closely and if there is no improvement or worsening symptoms she is instructed to go to the ER and verbalized understanding. Final Clinical Impressions(s) / UC Diagnoses   Final diagnoses:  Cellulitis of right hand     Discharge Instructions      Keep clean and dry and start doxycycline  twice daily for 10 days.  Monitor your symptoms closely if you are not having any improvement and/or redness worsening please go to the emergency room for further evaluation.  Hope you feel better soon!    ED Prescriptions     Medication Sig Dispense Auth. Provider   doxycycline  (VIBRAMYCIN ) 100 MG capsule Take 1  capsule (100 mg total) by mouth 2 (two) times daily. 20 capsule Florie Carico, Jodi R, NP      PDMP not reviewed this encounter.   Loreda Myla SAUNDERS, NP 08/13/24 1041

## 2024-08-13 NOTE — Discharge Instructions (Addendum)
 Keep clean and dry and start doxycycline  twice daily for 10 days.  Monitor your symptoms closely if you are not having any improvement and/or redness worsening please go to the emergency room for further evaluation.  Hope you feel better soon!

## 2024-08-13 NOTE — ED Triage Notes (Signed)
 Pt states some how got a wound on the posterior of right hand in the 5th knuckle area. Pt states noticed the reddness in the areax6days ago. The area has 1+ swelling and is erythematous
